# Patient Record
Sex: Female | Born: 1990 | Race: Asian | Hispanic: No | Marital: Married | Smoking: Never smoker
Health system: Southern US, Community
[De-identification: ages and names within clinical notes are randomized; demographics above are authoritative.]

## PROBLEM LIST (undated history)

## (undated) ENCOUNTER — Inpatient Hospital Stay (HOSPITAL_COMMUNITY): Payer: Medicaid Other

## (undated) DIAGNOSIS — Z789 Other specified health status: Secondary | ICD-10-CM

---
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
## 2010-08-21 ENCOUNTER — Inpatient Hospital Stay (HOSPITAL_COMMUNITY): Admission: AD | Admit: 2010-08-21 | Discharge: 2010-08-24 | Payer: Self-pay | Admitting: Obstetrics and Gynecology

## 2010-09-29 ENCOUNTER — Inpatient Hospital Stay (HOSPITAL_COMMUNITY): Admission: AD | Admit: 2010-09-29 | Discharge: 2010-10-01 | Payer: Self-pay | Admitting: Obstetrics and Gynecology

## 2011-01-12 LAB — CBC
HCT: 31.9 % — ABNORMAL LOW (ref 36.0–46.0)
HCT: 36.4 % (ref 36.0–46.0)
Hemoglobin: 12.3 g/dL (ref 12.0–15.0)
MCHC: 33.7 g/dL (ref 30.0–36.0)
MCV: 81.8 fL (ref 78.0–100.0)
RBC: 3.91 MIL/uL (ref 3.87–5.11)
WBC: 14.4 10*3/uL — ABNORMAL HIGH (ref 4.0–10.5)
WBC: 15.3 10*3/uL — ABNORMAL HIGH (ref 4.0–10.5)

## 2011-01-13 LAB — DIFFERENTIAL
Basophils Absolute: 0 10*3/uL (ref 0.0–0.1)
Eosinophils Absolute: 0.1 10*3/uL (ref 0.0–0.7)
Eosinophils Relative: 1 % (ref 0–5)
Monocytes Absolute: 0.8 10*3/uL (ref 0.1–1.0)
Monocytes Relative: 6 % (ref 3–12)
Neutro Abs: 10.7 10*3/uL — ABNORMAL HIGH (ref 1.7–7.7)

## 2011-01-13 LAB — RPR: RPR Ser Ql: NONREACTIVE

## 2011-01-13 LAB — CBC
Platelets: 183 10*3/uL (ref 150–400)
RDW: 14.5 % (ref 11.5–15.5)
WBC: 13.6 10*3/uL — ABNORMAL HIGH (ref 4.0–10.5)

## 2011-01-13 LAB — URINALYSIS, MICROSCOPIC ONLY
Bilirubin Urine: NEGATIVE
Ketones, ur: 15 mg/dL — AB
Leukocytes, UA: NEGATIVE
Nitrite: NEGATIVE
Specific Gravity, Urine: 1.025 (ref 1.005–1.030)
Urobilinogen, UA: 0.2 mg/dL (ref 0.0–1.0)

## 2011-01-13 LAB — COMPREHENSIVE METABOLIC PANEL
ALT: 15 U/L (ref 0–35)
Albumin: 3 g/dL — ABNORMAL LOW (ref 3.5–5.2)
Alkaline Phosphatase: 101 U/L (ref 39–117)
Potassium: 3.4 mEq/L — ABNORMAL LOW (ref 3.5–5.1)
Sodium: 138 mEq/L (ref 135–145)
Total Protein: 6.4 g/dL (ref 6.0–8.3)

## 2011-01-13 LAB — STREP B DNA PROBE: Strep Group B Ag: NEGATIVE

## 2011-01-13 LAB — GC/CHLAMYDIA PROBE AMP, GENITAL: Chlamydia, DNA Probe: NEGATIVE

## 2011-05-03 IMAGING — US US OB COMP +14 WK
1 series · 14 of 28 positions shown · non-contrast
Comparison: none

OBSTETRICAL ULTRASOUND:
 This ultrasound exam was performed in the [HOSPITAL] Ultrasound Department.  The OB US report was generated in the AS system, and faxed to the ordering physician.  This report is also available in [HOSPITAL]?s [employer] and in [REDACTED] PACS.

[Series 1: us ob comp +14 wk · 0.25mm/px · 14 of 79 slices shown]
[im 3/79]
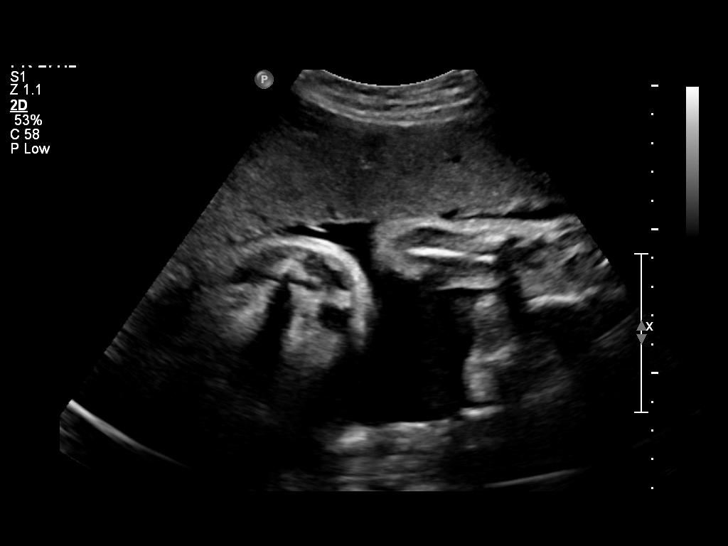
[im 9/79]
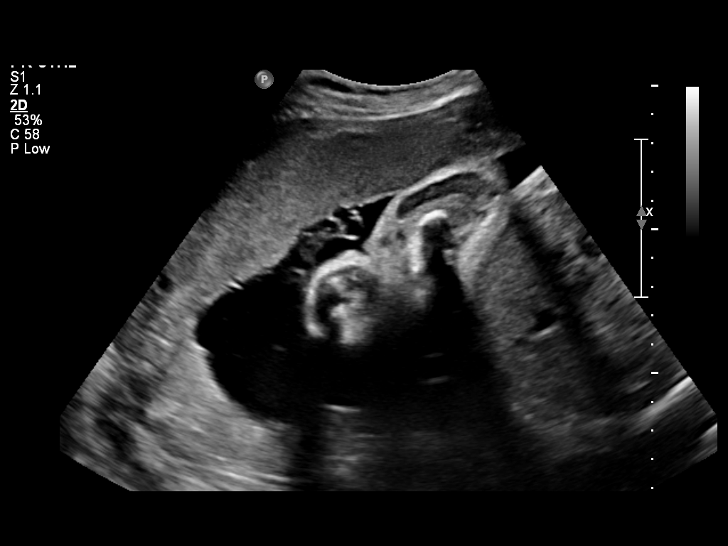
[im 15/79]
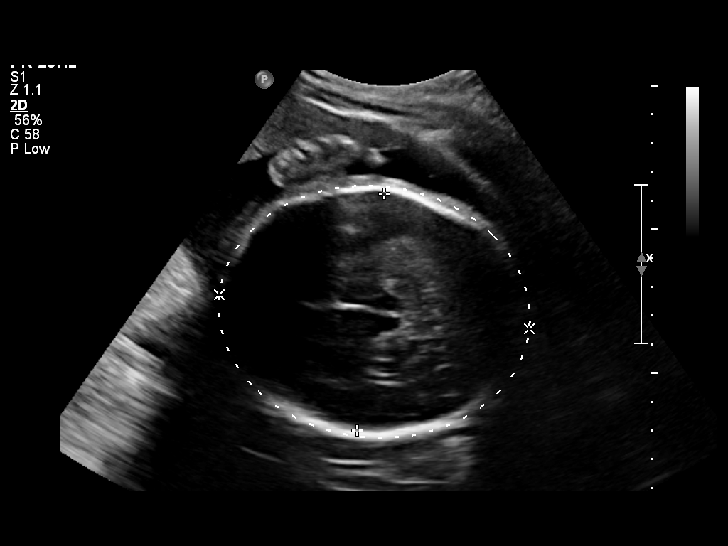
[im 21/79]
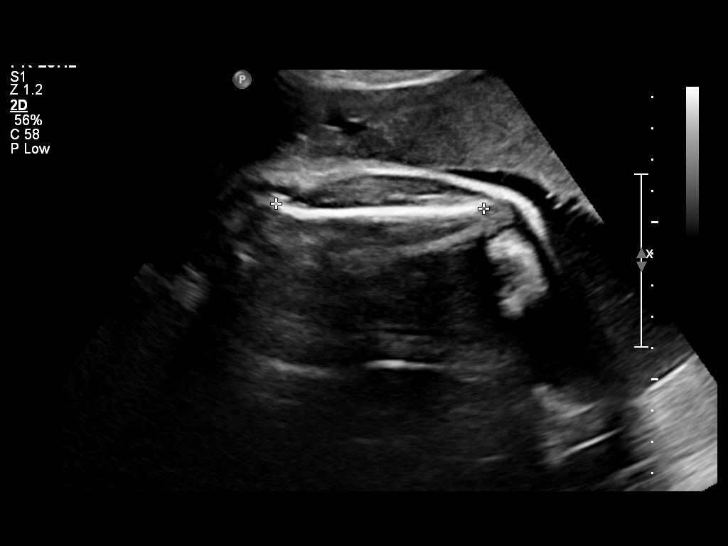
[im 27/79]
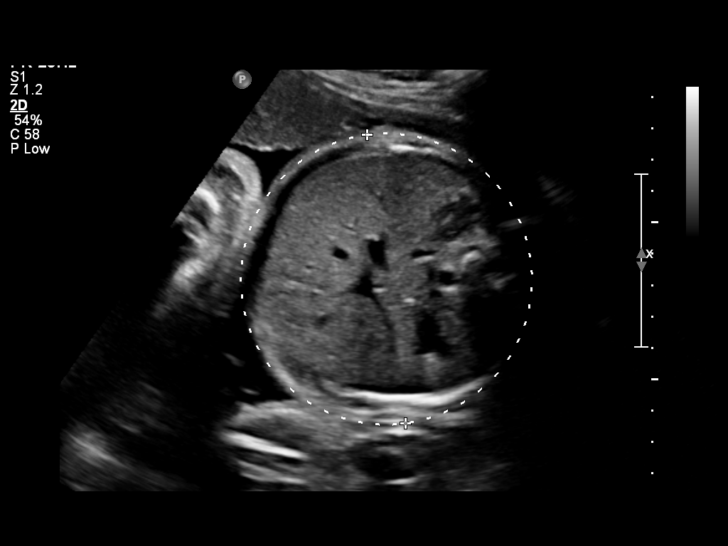
[im 32/79]
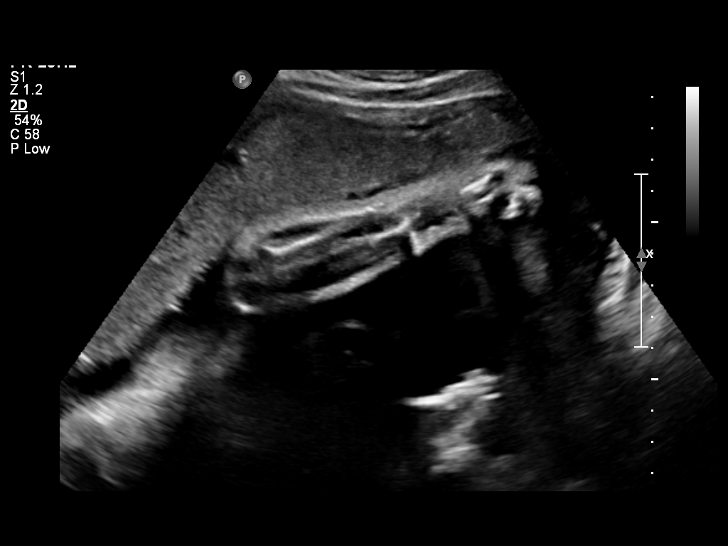
[im 38/79]
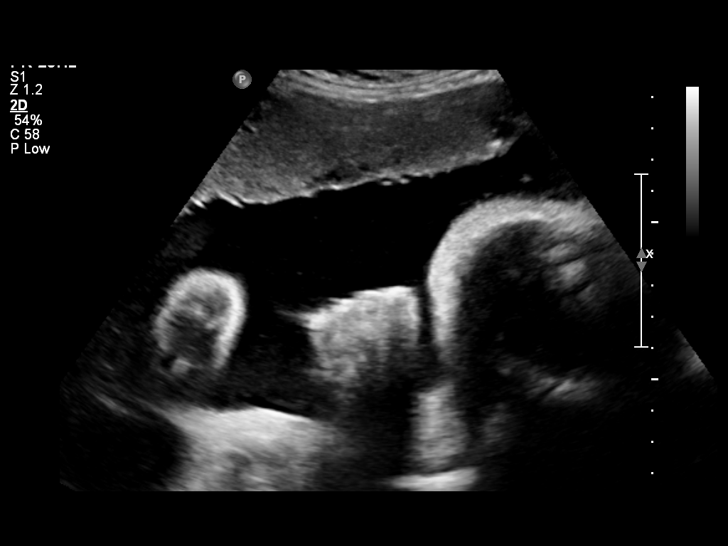
[im 44/79]
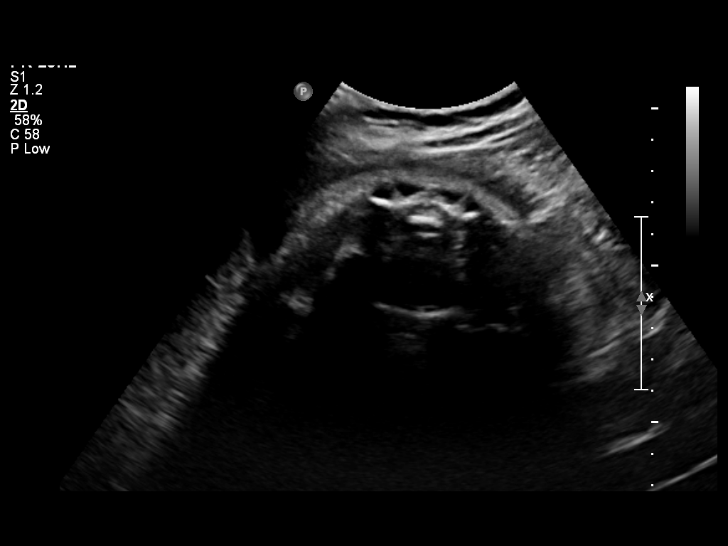
[im 50/79]
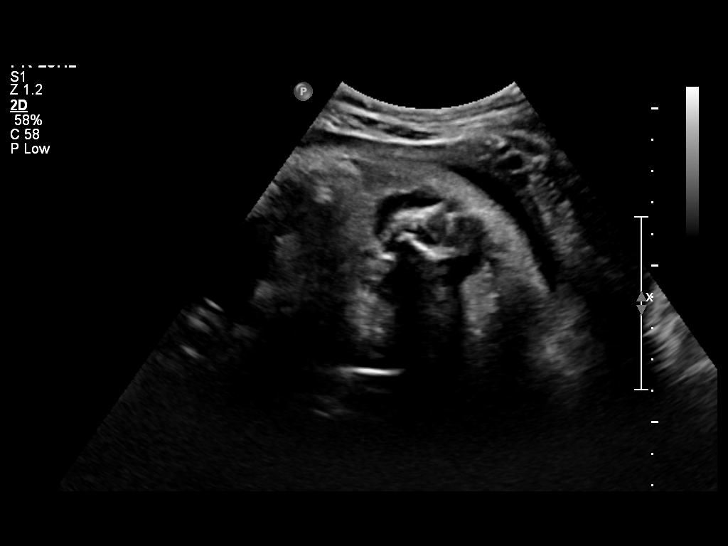
[im 55/79]
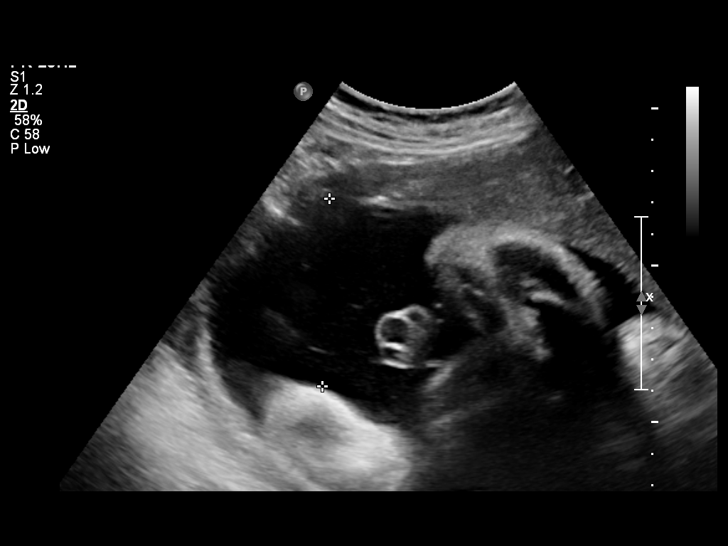
[im 61/79]
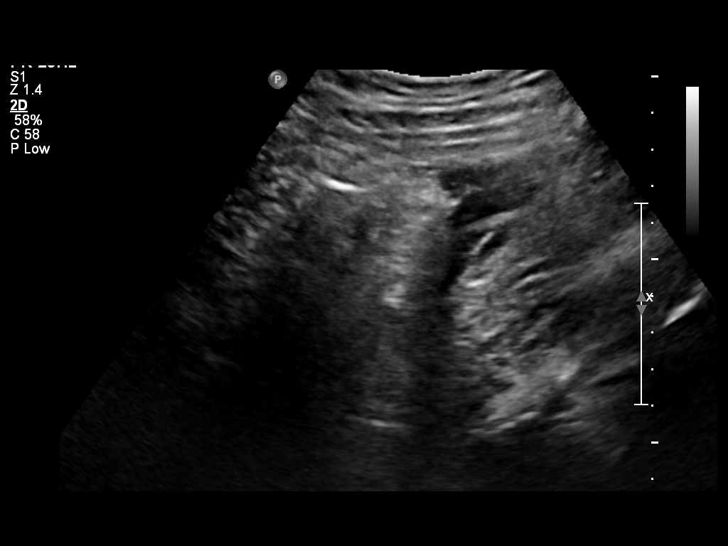
[im 67/79]
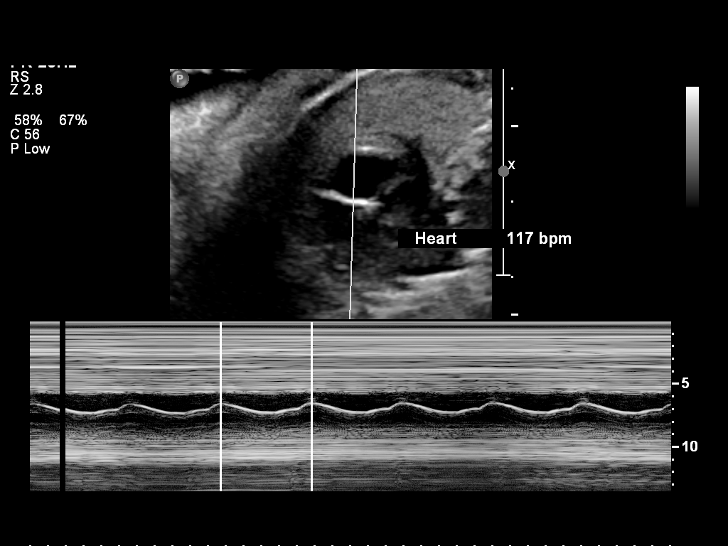
[im 73/79]
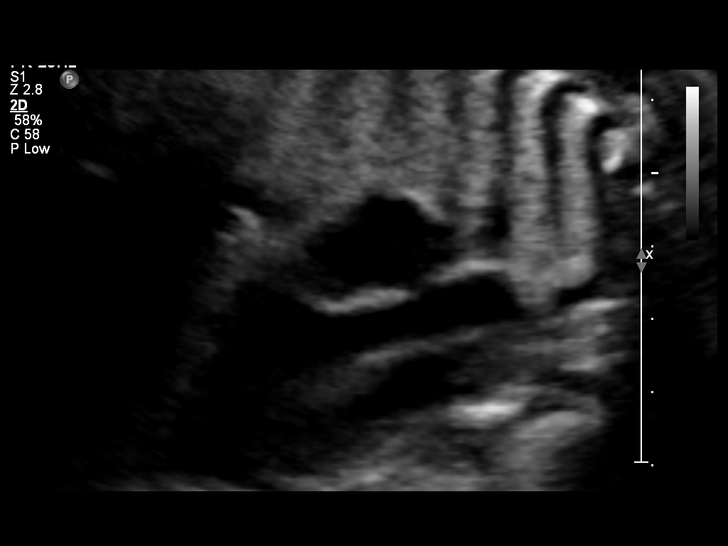
[im 79/79]
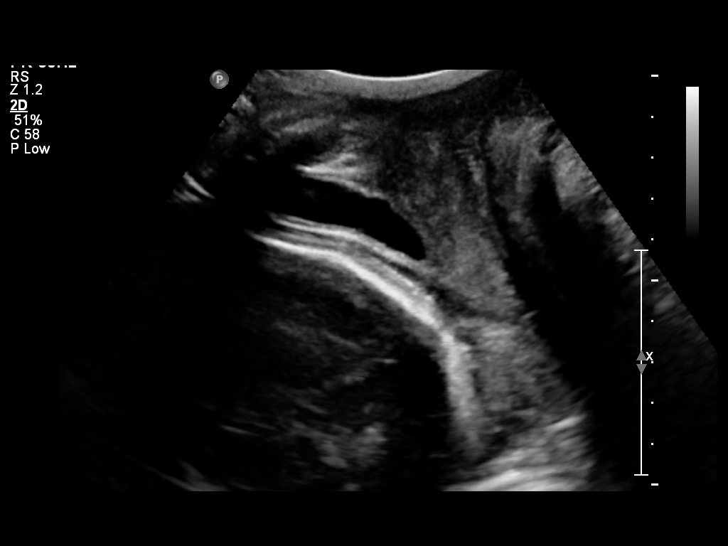

[14 of 28 positions shown; findings below may reference images not displayed]

IMPRESSION: See AS Obstetric US report.

## 2012-01-25 LAB — OB RESULTS CONSOLE HIV ANTIBODY (ROUTINE TESTING): HIV: NONREACTIVE

## 2012-01-25 LAB — OB RESULTS CONSOLE GC/CHLAMYDIA
Chlamydia: NEGATIVE
Gonorrhea: NEGATIVE

## 2012-01-25 LAB — OB RESULTS CONSOLE HEPATITIS B SURFACE ANTIGEN: Hepatitis B Surface Ag: NEGATIVE

## 2012-01-25 LAB — OB RESULTS CONSOLE RPR: RPR: NONREACTIVE

## 2012-01-25 LAB — OB RESULTS CONSOLE RUBELLA ANTIBODY, IGM: Rubella: IMMUNE

## 2012-05-28 ENCOUNTER — Inpatient Hospital Stay (HOSPITAL_COMMUNITY)
Admission: AD | Admit: 2012-05-28 | Discharge: 2012-05-31 | DRG: 766 | Disposition: A | Discharge status: Home / Self Care | Payer: Medicaid Other | Source: Ambulatory Visit | Attending: Obstetrics & Gynecology | Admitting: Obstetrics & Gynecology

## 2012-05-28 ENCOUNTER — Inpatient Hospital Stay (HOSPITAL_COMMUNITY): Payer: Medicaid Other | Admitting: Anesthesiology

## 2012-05-28 ENCOUNTER — Encounter (HOSPITAL_COMMUNITY): Payer: Self-pay | Admitting: Anesthesiology

## 2012-05-28 ENCOUNTER — Encounter (HOSPITAL_COMMUNITY): Payer: Self-pay | Admitting: *Deleted

## 2012-05-28 ENCOUNTER — Encounter (HOSPITAL_COMMUNITY)
Admission: AD | Disposition: A | Discharge status: Home / Self Care | Payer: Self-pay | Source: Ambulatory Visit | Attending: Obstetrics & Gynecology

## 2012-05-28 DIAGNOSIS — Z98891 History of uterine scar from previous surgery: Secondary | ICD-10-CM

## 2012-05-28 DIAGNOSIS — IMO0001 Reserved for inherently not codable concepts without codable children: Secondary | ICD-10-CM

## 2012-05-28 HISTORY — DX: Other specified health status: Z78.9

## 2012-05-28 LAB — CBC
HCT: 39.3 % (ref 36.0–46.0)
Hemoglobin: 12.9 g/dL (ref 12.0–15.0)
MCH: 26.2 pg (ref 26.0–34.0)
MCV: 79.7 fL (ref 78.0–100.0)
Platelets: 198 10*3/uL (ref 150–400)
RBC: 4.93 MIL/uL (ref 3.87–5.11)
WBC: 13.7 10*3/uL — ABNORMAL HIGH (ref 4.0–10.5)

## 2012-05-28 SURGERY — Surgical Case
Anesthesia: Regional | Site: Abdomen | Wound class: Clean Contaminated

## 2012-05-28 MED ORDER — MEPERIDINE HCL 25 MG/ML IJ SOLN: 6.2500 mg | INTRAMUSCULAR | Status: DC | PRN

## 2012-05-28 MED ORDER — EPHEDRINE SULFATE 50 MG/ML IJ SOLN
INTRAMUSCULAR | Status: DC | PRN
  Administered 2012-05-28 (×3): 10 mg via INTRAVENOUS

## 2012-05-28 MED ORDER — FERROUS SULFATE 325 (65 FE) MG PO TABS
325.0000 mg | ORAL_TABLET | Freq: Two times a day (BID) | ORAL | Status: DC
  Administered 2012-05-29 – 2012-05-31 (×4): 325 mg via ORAL
  Filled 2012-05-28 (×4): qty 1

## 2012-05-28 MED ORDER — PRENATAL MULTIVITAMIN CH
1.0000 | ORAL_TABLET | Freq: Every day | ORAL | Status: DC
  Administered 2012-05-29 – 2012-05-31 (×3): 1 via ORAL
  Filled 2012-05-28 (×3): qty 1

## 2012-05-28 MED ORDER — OXYTOCIN 10 UNIT/ML IJ SOLN
INTRAMUSCULAR | Status: AC
  Filled 2012-05-28: qty 4

## 2012-05-28 MED ORDER — METOCLOPRAMIDE HCL 5 MG/ML IJ SOLN: 10.0000 mg | Freq: Three times a day (TID) | INTRAMUSCULAR | Status: DC | PRN

## 2012-05-28 MED ORDER — NALBUPHINE HCL 10 MG/ML IJ SOLN
5.0000 mg | INTRAMUSCULAR | Status: DC | PRN
  Filled 2012-05-28: qty 1

## 2012-05-28 MED ORDER — LACTATED RINGERS IV SOLN
INTRAVENOUS | Status: DC
  Administered 2012-05-28: 125 mL/h via INTRAVENOUS
  Administered 2012-05-29: 05:00:00 via INTRAVENOUS

## 2012-05-28 MED ORDER — EPHEDRINE 5 MG/ML INJ
INTRAVENOUS | Status: AC
  Filled 2012-05-28: qty 10

## 2012-05-28 MED ORDER — KETOROLAC TROMETHAMINE 60 MG/2ML IM SOLN
INTRAMUSCULAR | Status: AC
  Administered 2012-05-28: 60 mg via INTRAMUSCULAR
  Filled 2012-05-28: qty 2

## 2012-05-28 MED ORDER — KETOROLAC TROMETHAMINE 60 MG/2ML IM SOLN
60.0000 mg | Freq: Once | INTRAMUSCULAR | Status: AC | PRN
  Administered 2012-05-28: 60 mg via INTRAMUSCULAR

## 2012-05-28 MED ORDER — FLEET ENEMA 7-19 GM/118ML RE ENEM: 1.0000 | ENEMA | RECTAL | Status: DC | PRN

## 2012-05-28 MED ORDER — ONDANSETRON HCL 4 MG/2ML IJ SOLN
INTRAMUSCULAR | Status: DC | PRN
  Administered 2012-05-28: 4 mg via INTRAVENOUS

## 2012-05-28 MED ORDER — OXYTOCIN 40 UNITS IN LACTATED RINGERS INFUSION - SIMPLE MED
62.5000 mL/h | Freq: Once | INTRAVENOUS | Status: DC
  Filled 2012-05-28: qty 1000

## 2012-05-28 MED ORDER — CITRIC ACID-SODIUM CITRATE 334-500 MG/5ML PO SOLN
30.0000 mL | ORAL | Status: DC | PRN
  Administered 2012-05-28: 30 mL via ORAL
  Filled 2012-05-28: qty 15

## 2012-05-28 MED ORDER — ACETAMINOPHEN 325 MG PO TABS: 650.0000 mg | ORAL_TABLET | ORAL | Status: DC | PRN

## 2012-05-28 MED ORDER — PHENYLEPHRINE 40 MCG/ML (10ML) SYRINGE FOR IV PUSH (FOR BLOOD PRESSURE SUPPORT)
PREFILLED_SYRINGE | INTRAVENOUS | Status: AC
  Filled 2012-05-28: qty 5

## 2012-05-28 MED ORDER — OXYTOCIN 40 UNITS IN LACTATED RINGERS INFUSION - SIMPLE MED: 62.5000 mL/h | INTRAVENOUS | Status: AC

## 2012-05-28 MED ORDER — SIMETHICONE 80 MG PO CHEW
80.0000 mg | CHEWABLE_TABLET | ORAL | Status: DC | PRN
  Administered 2012-05-28 – 2012-05-31 (×3): 80 mg via ORAL

## 2012-05-28 MED ORDER — PHENYLEPHRINE 40 MCG/ML (10ML) SYRINGE FOR IV PUSH (FOR BLOOD PRESSURE SUPPORT)
80.0000 ug | PREFILLED_SYRINGE | INTRAVENOUS | Status: DC | PRN
  Filled 2012-05-28: qty 5
  Filled 2012-05-28: qty 2

## 2012-05-28 MED ORDER — MEPERIDINE HCL 25 MG/ML IJ SOLN
INTRAMUSCULAR | Status: DC | PRN
  Administered 2012-05-28 (×2): 12.5 mg via INTRAVENOUS

## 2012-05-28 MED ORDER — MAGNESIUM HYDROXIDE 400 MG/5ML PO SUSP: 30.0000 mL | ORAL | Status: DC | PRN

## 2012-05-28 MED ORDER — MORPHINE SULFATE (PF) 0.5 MG/ML IJ SOLN
INTRAMUSCULAR | Status: DC | PRN
  Administered 2012-05-28: 1.5 mg via INTRAVENOUS

## 2012-05-28 MED ORDER — MEPERIDINE HCL 25 MG/ML IJ SOLN
INTRAMUSCULAR | Status: AC
  Filled 2012-05-28: qty 1

## 2012-05-28 MED ORDER — DIPHENHYDRAMINE HCL 50 MG/ML IJ SOLN: 12.5000 mg | INTRAMUSCULAR | Status: DC | PRN

## 2012-05-28 MED ORDER — OXYCODONE-ACETAMINOPHEN 5-325 MG PO TABS: 1.0000 | ORAL_TABLET | ORAL | Status: DC | PRN

## 2012-05-28 MED ORDER — LACTATED RINGERS IV SOLN: 500.0000 mL | INTRAVENOUS | Status: DC | PRN

## 2012-05-28 MED ORDER — OXYTOCIN BOLUS FROM INFUSION
250.0000 mL | Freq: Once | INTRAVENOUS | Status: DC
  Filled 2012-05-28: qty 500

## 2012-05-28 MED ORDER — WITCH HAZEL-GLYCERIN EX PADS: 1.0000 "application " | MEDICATED_PAD | CUTANEOUS | Status: DC | PRN

## 2012-05-28 MED ORDER — ONDANSETRON HCL 4 MG/2ML IJ SOLN
INTRAMUSCULAR | Status: AC
  Filled 2012-05-28: qty 2

## 2012-05-28 MED ORDER — LACTATED RINGERS IV SOLN
INTRAVENOUS | Status: DC | PRN
  Administered 2012-05-28: 18:00:00 via INTRAVENOUS

## 2012-05-28 MED ORDER — SCOPOLAMINE 1 MG/3DAYS TD PT72
MEDICATED_PATCH | TRANSDERMAL | Status: AC
  Administered 2012-05-28: 1.5 mg via TRANSDERMAL
  Filled 2012-05-28: qty 1

## 2012-05-28 MED ORDER — SODIUM CHLORIDE 0.9 % IR SOLN
Status: DC | PRN
  Administered 2012-05-28: 2000 mL

## 2012-05-28 MED ORDER — TETANUS-DIPHTH-ACELL PERTUSSIS 5-2.5-18.5 LF-MCG/0.5 IM SUSP: 0.5000 mL | Freq: Once | INTRAMUSCULAR | Status: DC

## 2012-05-28 MED ORDER — CEFAZOLIN SODIUM 1-5 GM-% IV SOLN
INTRAVENOUS | Status: DC | PRN
  Administered 2012-05-28: 2 g via INTRAVENOUS

## 2012-05-28 MED ORDER — DIPHENHYDRAMINE HCL 50 MG/ML IJ SOLN: 25.0000 mg | INTRAMUSCULAR | Status: DC | PRN

## 2012-05-28 MED ORDER — PHENYLEPHRINE 40 MCG/ML (10ML) SYRINGE FOR IV PUSH (FOR BLOOD PRESSURE SUPPORT)
80.0000 ug | PREFILLED_SYRINGE | INTRAVENOUS | Status: DC | PRN
  Filled 2012-05-28: qty 2

## 2012-05-28 MED ORDER — LACTATED RINGERS IV SOLN
INTRAVENOUS | Status: DC
  Administered 2012-05-28: 17:00:00 via INTRAVENOUS

## 2012-05-28 MED ORDER — SODIUM BICARBONATE 8.4 % IV SOLN
INTRAVENOUS | Status: AC
  Filled 2012-05-28: qty 50

## 2012-05-28 MED ORDER — KETOROLAC TROMETHAMINE 30 MG/ML IJ SOLN: 30.0000 mg | Freq: Four times a day (QID) | INTRAMUSCULAR | Status: AC | PRN

## 2012-05-28 MED ORDER — DIPHENHYDRAMINE HCL 25 MG PO CAPS: 25.0000 mg | ORAL_CAPSULE | Freq: Four times a day (QID) | ORAL | Status: DC | PRN

## 2012-05-28 MED ORDER — MEASLES, MUMPS & RUBELLA VAC ~~LOC~~ INJ
0.5000 mL | INJECTION | Freq: Once | SUBCUTANEOUS | Status: DC
  Filled 2012-05-28: qty 0.5

## 2012-05-28 MED ORDER — PROPOFOL 10 MG/ML IV EMUL
INTRAVENOUS | Status: AC
  Filled 2012-05-28: qty 20

## 2012-05-28 MED ORDER — LACTATED RINGERS IV SOLN
500.0000 mL | Freq: Once | INTRAVENOUS | Status: AC
  Administered 2012-05-28: 500 mL via INTRAVENOUS

## 2012-05-28 MED ORDER — ONDANSETRON HCL 4 MG PO TABS: 4.0000 mg | ORAL_TABLET | ORAL | Status: DC | PRN

## 2012-05-28 MED ORDER — DIPHENHYDRAMINE HCL 50 MG/ML IJ SOLN
12.5000 mg | INTRAMUSCULAR | Status: DC | PRN
Start: 2012-05-28 — End: 2012-05-28

## 2012-05-28 MED ORDER — LIDOCAINE-EPINEPHRINE (PF) 2 %-1:200000 IJ SOLN
INTRAMUSCULAR | Status: AC
  Filled 2012-05-28: qty 20

## 2012-05-28 MED ORDER — SODIUM CHLORIDE 0.9 % IJ SOLN: 3.0000 mL | INTRAMUSCULAR | Status: DC | PRN

## 2012-05-28 MED ORDER — LIDOCAINE HCL (PF) 1 % IJ SOLN
INTRAMUSCULAR | Status: DC | PRN
  Administered 2012-05-28 (×2): 8 mL

## 2012-05-28 MED ORDER — MORPHINE SULFATE (PF) 0.5 MG/ML IJ SOLN
INTRAMUSCULAR | Status: DC | PRN
  Administered 2012-05-28: 3.5 mg via EPIDURAL

## 2012-05-28 MED ORDER — ONDANSETRON HCL 4 MG/2ML IJ SOLN: 4.0000 mg | Freq: Four times a day (QID) | INTRAMUSCULAR | Status: DC | PRN

## 2012-05-28 MED ORDER — NALOXONE HCL 0.4 MG/ML IJ SOLN: 0.4000 mg | INTRAMUSCULAR | Status: DC | PRN

## 2012-05-28 MED ORDER — MORPHINE SULFATE 0.5 MG/ML IJ SOLN
INTRAMUSCULAR | Status: AC
  Filled 2012-05-28: qty 10

## 2012-05-28 MED ORDER — IBUPROFEN 600 MG PO TABS: 600.0000 mg | ORAL_TABLET | Freq: Four times a day (QID) | ORAL | Status: DC | PRN

## 2012-05-28 MED ORDER — CEFAZOLIN SODIUM-DEXTROSE 2-3 GM-% IV SOLR
INTRAVENOUS | Status: AC
  Filled 2012-05-28: qty 50

## 2012-05-28 MED ORDER — SCOPOLAMINE 1 MG/3DAYS TD PT72
1.0000 | MEDICATED_PATCH | Freq: Once | TRANSDERMAL | Status: DC
  Administered 2012-05-28: 1.5 mg via TRANSDERMAL

## 2012-05-28 MED ORDER — MEDROXYPROGESTERONE ACETATE 150 MG/ML IM SUSP: 150.0000 mg | INTRAMUSCULAR | Status: DC | PRN

## 2012-05-28 MED ORDER — MISOPROSTOL 25 MCG QUARTER TABLET
75.0000 ug | ORAL_TABLET | Freq: Once | ORAL | Status: AC
  Administered 2012-05-28: 75 ug via ORAL
  Filled 2012-05-28: qty 0.75

## 2012-05-28 MED ORDER — PHENYLEPHRINE HCL 10 MG/ML IJ SOLN
INTRAMUSCULAR | Status: DC | PRN
  Administered 2012-05-28: 80 ug via INTRAVENOUS
  Administered 2012-05-28: 120 ug via INTRAVENOUS

## 2012-05-28 MED ORDER — FENTANYL 2.5 MCG/ML BUPIVACAINE 1/10 % EPIDURAL INFUSION (WH - ANES)
14.0000 mL/h | INTRAMUSCULAR | Status: DC
  Administered 2012-05-28: 14 mL/h via EPIDURAL
  Filled 2012-05-28 (×2): qty 60

## 2012-05-28 MED ORDER — TERBUTALINE SULFATE 1 MG/ML IJ SOLN
INTRAMUSCULAR | Status: AC
  Administered 2012-05-28: 1 mg
  Filled 2012-05-28: qty 1

## 2012-05-28 MED ORDER — NALOXONE HCL 0.4 MG/ML IJ SOLN
1.0000 ug/kg/h | INTRAMUSCULAR | Status: DC | PRN
  Filled 2012-05-28: qty 2.5

## 2012-05-28 MED ORDER — KETOROLAC TROMETHAMINE 30 MG/ML IJ SOLN: 15.0000 mg | Freq: Once | INTRAMUSCULAR | Status: DC | PRN

## 2012-05-28 MED ORDER — LIDOCAINE HCL (PF) 1 % IJ SOLN
30.0000 mL | INTRAMUSCULAR | Status: DC | PRN
  Filled 2012-05-28 (×2): qty 30

## 2012-05-28 MED ORDER — PROMETHAZINE HCL 25 MG/ML IJ SOLN: 6.2500 mg | INTRAMUSCULAR | Status: DC | PRN

## 2012-05-28 MED ORDER — OXYTOCIN 10 UNIT/ML IJ SOLN
40.0000 [IU] | INTRAVENOUS | Status: DC | PRN
  Administered 2012-05-28: 40 [IU] via INTRAVENOUS

## 2012-05-28 MED ORDER — DIPHENHYDRAMINE HCL 25 MG PO CAPS: 25.0000 mg | ORAL_CAPSULE | ORAL | Status: DC | PRN

## 2012-05-28 MED ORDER — ONDANSETRON HCL 4 MG/2ML IJ SOLN: 4.0000 mg | INTRAMUSCULAR | Status: DC | PRN

## 2012-05-28 MED ORDER — IBUPROFEN 600 MG PO TABS
600.0000 mg | ORAL_TABLET | Freq: Four times a day (QID) | ORAL | Status: DC
  Administered 2012-05-29 – 2012-05-31 (×9): 600 mg via ORAL
  Filled 2012-05-28 (×10): qty 1

## 2012-05-28 MED ORDER — OXYCODONE-ACETAMINOPHEN 5-325 MG PO TABS
1.0000 | ORAL_TABLET | ORAL | Status: DC | PRN
  Administered 2012-05-29: 1 via ORAL
  Administered 2012-05-29 – 2012-05-31 (×7): 2 via ORAL
  Filled 2012-05-28: qty 1
  Filled 2012-05-28 (×3): qty 2
  Filled 2012-05-28: qty 1
  Filled 2012-05-28 (×4): qty 2

## 2012-05-28 MED ORDER — SENNOSIDES-DOCUSATE SODIUM 8.6-50 MG PO TABS
2.0000 | ORAL_TABLET | Freq: Every day | ORAL | Status: DC
  Administered 2012-05-28 – 2012-05-29 (×2): 2 via ORAL

## 2012-05-28 MED ORDER — DIBUCAINE 1 % RE OINT: 1.0000 "application " | TOPICAL_OINTMENT | RECTAL | Status: DC | PRN

## 2012-05-28 MED ORDER — FENTANYL 2.5 MCG/ML BUPIVACAINE 1/10 % EPIDURAL INFUSION (WH - ANES)
INTRAMUSCULAR | Status: DC | PRN
  Administered 2012-05-28: 14 mL/h via EPIDURAL

## 2012-05-28 MED ORDER — EPHEDRINE 5 MG/ML INJ
10.0000 mg | INTRAVENOUS | Status: DC | PRN
  Filled 2012-05-28: qty 2
  Filled 2012-05-28: qty 4

## 2012-05-28 MED ORDER — EPHEDRINE 5 MG/ML INJ
10.0000 mg | INTRAVENOUS | Status: DC | PRN
  Filled 2012-05-28: qty 2

## 2012-05-28 MED ORDER — LANOLIN HYDROUS EX OINT: 1.0000 "application " | TOPICAL_OINTMENT | CUTANEOUS | Status: DC | PRN

## 2012-05-28 MED ORDER — ONDANSETRON HCL 4 MG/2ML IJ SOLN: 4.0000 mg | Freq: Three times a day (TID) | INTRAMUSCULAR | Status: DC | PRN

## 2012-05-28 MED ORDER — HYDROMORPHONE HCL PF 1 MG/ML IJ SOLN: 0.2500 mg | INTRAMUSCULAR | Status: DC | PRN

## 2012-05-28 MED ORDER — SODIUM BICARBONATE 8.4 % IV SOLN
INTRAVENOUS | Status: DC | PRN
  Administered 2012-05-28: 10 mL via EPIDURAL

## 2012-05-28 MED ORDER — ZOLPIDEM TARTRATE 5 MG PO TABS: 5.0000 mg | ORAL_TABLET | Freq: Every evening | ORAL | Status: DC | PRN

## 2012-05-28 SURGICAL SUPPLY — 46 items
APL SKNCLS STERI-STRIP NONHPOA (GAUZE/BANDAGES/DRESSINGS) ×1
BENZOIN TINCTURE PRP APPL 2/3 (GAUZE/BANDAGES/DRESSINGS) ×2 IMPLANT
CANISTER WOUND CARE 500ML ATS (WOUND CARE) IMPLANT
CHLORAPREP W/TINT 26ML (MISCELLANEOUS) ×1 IMPLANT
CLOTH BEACON ORANGE TIMEOUT ST (SAFETY) ×2 IMPLANT
CONTAINER PREFILL 10% NBF 15ML (MISCELLANEOUS) IMPLANT
DRESSING TELFA 8X3 (GAUZE/BANDAGES/DRESSINGS) ×2 IMPLANT
DRSG COVADERM 4X10 (GAUZE/BANDAGES/DRESSINGS) ×1 IMPLANT
DRSG VAC ATS LRG SENSATRAC (GAUZE/BANDAGES/DRESSINGS) IMPLANT
DRSG VAC ATS MED SENSATRAC (GAUZE/BANDAGES/DRESSINGS) IMPLANT
DRSG VAC ATS SM SENSATRAC (GAUZE/BANDAGES/DRESSINGS) IMPLANT
ELECT REM PT RETURN 9FT ADLT (ELECTROSURGICAL) ×2
ELECTRODE REM PT RTRN 9FT ADLT (ELECTROSURGICAL) ×1 IMPLANT
EXTRACTOR VACUUM M CUP 4 TUBE (SUCTIONS) IMPLANT
FORMULA 20CAL 3 OZ MEAD (FORMULA) ×1 IMPLANT
GAUZE SPONGE 4X4 12PLY STRL LF (GAUZE/BANDAGES/DRESSINGS) ×2 IMPLANT
GLOVE BIO SURGEON STRL SZ 6.5 (GLOVE) ×4 IMPLANT
GOWN PREVENTION PLUS LG XLONG (DISPOSABLE) ×5 IMPLANT
KIT ABG SYR 3ML LUER SLIP (SYRINGE) ×1 IMPLANT
NDL HYPO 25X5/8 SAFETYGLIDE (NEEDLE) ×1 IMPLANT
NEEDLE HYPO 25X5/8 SAFETYGLIDE (NEEDLE) ×2 IMPLANT
NS IRRIG 1000ML POUR BTL (IV SOLUTION) ×2 IMPLANT
PACK C SECTION WH (CUSTOM PROCEDURE TRAY) ×2 IMPLANT
PAD ABD 7.5X8 STRL (GAUZE/BANDAGES/DRESSINGS) ×2 IMPLANT
RTRCTR C-SECT PINK 25CM LRG (MISCELLANEOUS) IMPLANT
RTRCTR C-SECT PINK 34CM XLRG (MISCELLANEOUS) IMPLANT
SET CYSTO W/LG BORE CLAMP LF (SET/KITS/TRAYS/PACK) ×1 IMPLANT
SLEEVE SCD COMPRESS KNEE MED (MISCELLANEOUS) ×1 IMPLANT
STAPLER VISISTAT 35W (STAPLE) IMPLANT
STRIP CLOSURE SKIN 1/2X4 (GAUZE/BANDAGES/DRESSINGS) ×2 IMPLANT
SUT MNCRL 0 VIOLET CTX 36 (SUTURE) ×2 IMPLANT
SUT MNCRL AB 3-0 PS2 27 (SUTURE) ×1 IMPLANT
SUT MONOCRYL 0 CTX 36 (SUTURE) ×2
SUT PDS AB 0 CTX 36 PDP370T (SUTURE) ×1 IMPLANT
SUT PLAIN 0 NONE (SUTURE) IMPLANT
SUT VIC AB 0 CTXB 36 (SUTURE) ×2 IMPLANT
SUT VIC AB 2-0 CT1 (SUTURE) IMPLANT
SUT VIC AB 2-0 CT1 27 (SUTURE) ×2
SUT VIC AB 2-0 CT1 TAPERPNT 27 (SUTURE) ×1 IMPLANT
SUT VIC AB 2-0 SH 27 (SUTURE)
SUT VIC AB 2-0 SH 27XBRD (SUTURE) IMPLANT
TAPE CLOTH SURG 4X10 WHT LF (GAUZE/BANDAGES/DRESSINGS) ×1 IMPLANT
TOWEL OR 17X24 6PK STRL BLUE (TOWEL DISPOSABLE) ×4 IMPLANT
TRAY FOLEY CATH 14FR (SET/KITS/TRAYS/PACK) ×2 IMPLANT
WATER STERILE IRR 1000ML POUR (IV SOLUTION) ×2 IMPLANT
WATER STERILE IRR 1000ML UROMA (IV SOLUTION) ×1 IMPLANT

## 2012-05-28 NOTE — Anesthesia Procedure Notes (Signed)
Epidural Patient location during procedure: OB Start time: 05/28/2012 1:27 PM End time: 05/28/2012 1:32 PM Reason for block: procedure for pain  Staffing Anesthesiologist: Sandrea Hughs Performed by: anesthesiologist   Preanesthetic Checklist Completed: patient identified, site marked, surgical consent, pre-op evaluation, timeout performed, IV checked, risks and benefits discussed and monitors and equipment checked  Epidural Patient position: sitting Prep: site prepped and draped and DuraPrep Patient monitoring: continuous pulse ox and blood pressure Approach: midline Injection technique: LOR air  Needle:  Needle type: Tuohy  Needle gauge: 17 G Needle length: 9 cm Needle insertion depth: 5 cm cm Catheter type: closed end flexible Catheter size: 19 Gauge Catheter at skin depth: 10 cm Test dose: negative and Other  Assessment Sensory level: T8 Events: blood not aspirated, injection not painful, no injection resistance, negative IV test and no paresthesia

## 2012-05-28 NOTE — OR Nursing (Signed)
Sterile water with 2 ounces  of sterile milk instilled into bladder before closing fascia to check for bladder tears-none  Noted per Dr. Tamela Oddi.

## 2012-05-28 NOTE — Progress Notes (Signed)
Sandra Wolfe is a 21 y.o. G2P1001 at [redacted]w[redacted]d by LMP admitted for active labor  Subjective: Pushing  Objective: BP 121/68  Pulse 81  Temp 98.1 F (36.7 C) (Oral)  Resp 20  Ht 4\' 11"  (1.499 m)  Wt 61.689 kg (136 lb)  BMI 27.47 kg/m2  SpO2 100% I/O last 3 completed shifts: In: 800 [I.V.:800] Out: 800 [Urine:100; Blood:700]    FHT:  FHR baseline 80s - 90s UC:   Every minute SVE:  FD/direct OA/+1 station Patient consented verbally for a vacuum-assisted delivery.  The bladder was catheterized. The vacuum was applied.  The pressure was 60.  There was minimal descent with three contractions/pushing efforts. Labs: Lab Results  Component Value Date   WBC 13.7* 05/28/2012   HGB 12.9 05/28/2012   HCT 39.3 05/28/2012   MCV 79.7 05/28/2012   PLT 198 05/28/2012    Assessment / Plan: Terminal bradycardia; failed vacuum attempt  To OR for an urgent C/D  JACKSON-MOORE,Avriel Kandel A 05/28/2012, 7:05 PM

## 2012-05-28 NOTE — Anesthesia Preprocedure Evaluation (Signed)

## 2012-05-28 NOTE — Progress Notes (Signed)
Karol Acoff is a 21 y.o. G2P1001 at [redacted]w[redacted]d by LMP admitted for active labor  Subjective: Comfortable  Objective: BP 126/75  Pulse 88  Temp 98.1 F (36.7 C) (Oral)  Resp 20  Ht 4\' 11"  (1.499 m)  Wt 61.689 kg (136 lb)  BMI 27.47 kg/m2  SpO2 100%      FHT:  FHR: 140 bpm, variability: moderate,  accelerations:  Present,  decelerations:  Absent UC:   regular, every 5 minutes SVE:   Dilation: 7 Effacement (%): 90 Station: 0 Exam by:: jackson-moore, md  Labs: Lab Results  Component Value Date   WBC 13.7* 05/28/2012   HGB 12.9 05/28/2012   HCT 39.3 05/28/2012   MCV 79.7 05/28/2012   PLT 198 05/28/2012    Assessment / Plan: Protracted active phase  Labor: Augment labor w/po Cytotec Preeclampsia:  n/a Fetal Wellbeing:  Category I Pain Control:  Epidural I/D:  n/a Anticipated MOD:  NSVD  JACKSON-MOORE,Renn Stille A 05/28/2012, 5:31 PM

## 2012-05-28 NOTE — Anesthesia Postprocedure Evaluation (Signed)
  Anesthesia Post-op Note  Patient: Sandra Wolfe  Procedure(s) Performed: Procedure(s) (LRB): CESAREAN SECTION (N/A)  Patient is awake, responsive, moving her legs, and has signs of resolution of her numbness. Pain and nausea are reasonably well controlled. Vital signs are stable and clinically acceptable. Oxygen saturation is clinically acceptable. There are no apparent anesthetic complications at this time. Patient is ready for discharge.

## 2012-05-28 NOTE — Transfer of Care (Signed)
Immediate Anesthesia Transfer of Care Note  Patient: Sandra Wolfe  Procedure(s) Performed: Procedure(s) (LRB): CESAREAN SECTION (N/A)  Patient Location: PACU  Anesthesia Type: Epidural  Level of Consciousness: awake, alert  and oriented  Airway & Oxygen Therapy: Patient Spontanous Breathing  Post-op Assessment: Report given to PACU RN and Post -op Vital signs reviewed and stable  Post vital signs: stable  Complications: No apparent anesthesia complications

## 2012-05-28 NOTE — OR Nursing (Signed)
Uterus massaged by D. Val Eagle RN. Two tubes of cord blood to lab. 50cc of blood evacuated from uterus during uterine  massage.

## 2012-05-28 NOTE — H&P (Signed)
Sandra Wolfe is a 21 y.o. female presenting for contractions. Maternal Medical History:  Reason for admission: Reason for admission: contractions.  Contractions: Frequency: regular.   Perceived severity is strong.    Fetal activity: Perceived fetal activity is normal.    Prenatal complications: no prenatal complications   OB History    Grav Para Term Preterm Abortions TAB SAB Ect Mult Living   2 1 1       1      Past Medical History  Diagnosis Date  . No pertinent past medical history    Past Surgical History  Procedure Date  . No past surgeries    Family History: family history is not on file. Family history is unknown by patient. Social History:  reports that she has never smoked. She does not have any smokeless tobacco history on file. She reports that she does not drink alcohol or use illicit drugs.     Review of Systems  Constitutional: Negative for fever.  Eyes: Negative for blurred vision.  Respiratory: Negative for shortness of breath.   Gastrointestinal: Negative for vomiting.  Skin: Negative for rash.  Neurological: Negative for headaches.      Blood pressure 120/69, pulse 88, temperature 97.8 F (36.6 C), temperature source Oral, resp. rate 20, height 4\' 11"  (1.499 m), weight 61.689 kg (136 lb). Maternal Exam:  Uterine Assessment: Contraction frequency is regular.   Abdomen: Patient reports no abdominal tenderness. Introitus: not evaluated.   Cervix: Cervix evaluated by digital exam.     Fetal Exam Fetal Monitor Review: Variability: moderate (6-25 bpm).   Pattern: accelerations present and no decelerations.    Fetal State Assessment: Category I - tracings are normal.     Physical Exam  Constitutional: She appears well-developed.  HENT:  Head: Normocephalic.  Neck: Neck supple. No thyromegaly present.  Cardiovascular: Normal rate and regular rhythm.   Respiratory: Breath sounds normal.  GI: Soft. Bowel sounds are normal.  Skin: No rash noted.       Assessment/Plan: Multipara at term, active labor, Category 1 FHT Admit, anticipate an NSVD   JACKSON-MOORE,Nile Prisk A 05/28/2012, 12:49 PM

## 2012-05-28 NOTE — Op Note (Signed)
Cesarean Section Procedure Note   Sandra Wolfe   05/28/2012  Indications: Terminal bradycardia, failed vacuum attempt   Pre-operative Diagnosis: Terminal bradycardia, failed vacuum attempt.   Post-operative Diagnosis: Same   Surgeon: Antionette Char A  Assistants: none  Anesthesia: epidural  Procedure Details:  The patient was identified as Sandra Wolfe and the procedure verified as C-Section Delivery. The patient was draped and prepped in the usual sterile manner. A transverse incision was made and carried down through the subcutaneous tissue to the fascia. The fascial incision was made and extended transversely.  The rectus muscles were separated bluntly.  The peritoneum was identified and entered bluntly.    A low transverse uterine incision was made above the bladder reflection. Delivered from cephalic presentation was a living newborn female infant.  A cord ph was sent. The pH was 7.19.  The umbilical cord was clamped and cut cord. A sample was obtained for evaluation. The placenta was removed Intact and appeared normal.  The uterine incision was closed with running locked sutures of 1-0 Monocryl. A second imbricating layer of the same suture was placed.  The bladder was freed from the lower uterine segment with the bovie.  Hemostasis was observed. The paracolic gutters were irrigated.  As there was hematuria noted, the bladder was filled in retrograde fashion with sterile milk.  There was no extravasation of the milk.   The parieto peritoneum was closed in a running fashion using a suture of 2-0 Vicryl.  The fascia was then reapproximated with running sutures of 1-0Vicryl. The subcuticular closure was performed using 3-0 Monocryl.  Instrument, sponge, and needle counts were correct prior the abdominal closure and were correct at the conclusion of the case.    Findings:  See above   Estimated Blood Loss: 700 ml  Total IV Fluids:   Urine Output: 100CC OF bloody urine  Specimens:  Placenta   Complications: no complications  Disposition: PACU - hemodynamically stable.  Maternal Condition: stable   Baby condition / location:  nursery-stable    Signed: Surgeon(s): Antionette Char, MD

## 2012-05-28 NOTE — MAU Note (Signed)
"  I started contracting at about 0930 every 7-10 mins.  They are stronger and closer together now.  No bleeding or leaking of fluid. (+) FM."

## 2012-05-28 NOTE — MAU Note (Signed)
Pt reports  Having ctx since 0930 q 5 min. Denies SROM or bleeding and reports good fetal movement

## 2012-05-29 ENCOUNTER — Encounter (HOSPITAL_COMMUNITY): Payer: Self-pay | Admitting: *Deleted

## 2012-05-29 MED ORDER — PROPOFOL 10 MG/ML IV EMUL
INTRAVENOUS | Status: AC
  Filled 2012-05-29: qty 20

## 2012-05-29 NOTE — Addendum Note (Signed)
Addendum  created 05/29/12 0819 by Shanon Payor, CRNA   Modules edited:Notes Section

## 2012-05-29 NOTE — Progress Notes (Signed)
Subjective: Postpartum Day 1: Cesarean Delivery Patient reports incisional pain, tolerating PO and no problems voiding.    Objective: Vital signs in last 24 hours: Temp:  [97.7 F (36.5 C)-98.6 F (37 C)] 98.2 F (36.8 C) (07/29 0714) Pulse Rate:  [68-110] 77  (07/29 0819) Resp:  [16-20] 20  (07/29 0714) BP: (89-126)/(46-86) 92/52 mmHg (07/29 0819) SpO2:  [95 %-100 %] 99 % (07/29 0520) Weight:  [61.689 kg (136 lb)] 61.689 kg (136 lb) (07/28 1139)  Physical Exam:  General: alert and no distress Lochia: appropriate Uterine Fundus: firm Incision: healing well DVT Evaluation: No evidence of DVT seen on physical exam.   Basename 05/29/12 0525 05/28/12 1230  HGB 8.5* 12.9  HCT -- 39.3    Assessment/Plan: Status post Cesarean section. Doing well postoperatively.  Continue current care.  Zarie Kosiba A 05/29/2012, 9:00 AM

## 2012-05-29 NOTE — Progress Notes (Signed)
Ur chart review completed.  

## 2012-05-29 NOTE — Anesthesia Postprocedure Evaluation (Signed)
  Anesthesia Post-op Note  Patient: Sandra Wolfe  Procedure(s) Performed: Procedure(s) (LRB): CESAREAN SECTION (N/A)  Patient Location: PACU and Mother/Baby  Anesthesia Type: Epidural  Level of Consciousness: awake, alert  and oriented  Airway and Oxygen Therapy: Patient Spontanous Breathing  Post-op Pain: none  Post-op Assessment: Post-op Vital signs reviewed and Patient's Cardiovascular Status Stable  Post-op Vital Signs: Reviewed and stable  Complications: No apparent anesthesia complications

## 2012-05-30 NOTE — Progress Notes (Signed)
Subjective: Postpartum Day 2: Cesarean Delivery Patient reports tolerating PO, + flatus and no problems voiding.    Objective: Vital signs in last 24 hours: Temp:  [97.7 F (36.5 C)-98.4 F (36.9 C)] 97.7 F (36.5 C) (07/30 0526) Pulse Rate:  [67-78] 67  (07/30 0625) Resp:  [17-18] 17  (07/30 0526) BP: (80-98)/(49-54) 87/49 mmHg (07/30 1610)  Physical Exam:  General: alert and no distress Lochia: appropriate Uterine Fundus: firm Incision: healing well DVT Evaluation: No evidence of DVT seen on physical exam.   Basename 05/29/12 0525 05/28/12 1230  HGB 8.5* 12.9  HCT -- 39.3    Assessment/Plan: Status post Cesarean section. Doing well postoperatively.  Continue current care.  Laron Boorman A 05/30/2012, 8:14 AM

## 2012-05-30 NOTE — Progress Notes (Signed)
Entered pt room to find pt asleep in bed with baby on pillow asleep beside her.  Reminded pt that baby should be in crib when she (mother) is asleep to prevent baby falling or suffocating.  Moved baby to crib.  Pt nodded in understanding.

## 2012-05-31 MED ORDER — OXYCODONE-ACETAMINOPHEN 5-325 MG PO TABS: 1.0000 | ORAL_TABLET | ORAL | Status: AC | PRN

## 2012-05-31 MED ORDER — IBUPROFEN 600 MG PO TABS: 600.0000 mg | ORAL_TABLET | Freq: Four times a day (QID) | ORAL | Status: AC

## 2012-05-31 NOTE — Progress Notes (Signed)
Subjective: Postpartum Day 3: Cesarean Delivery Patient reports tolerating PO, + flatus, + BM and no problems voiding.    Objective: Vital signs in last 24 hours: Temp:  [98 F (36.7 C)-98.5 F (36.9 C)] 98.5 F (36.9 C) (07/31 0532) Pulse Rate:  [73-83] 83  (07/31 0532) Resp:  [18] 18  (07/31 0532) BP: (99-118)/(55-87) 118/87 mmHg (07/31 0532) SpO2:  [100 %] 100 % (07/30 1314)  Physical Exam:  General: alert and no distress Lochia: appropriate Uterine Fundus: firm Incision: healing well DVT Evaluation: No evidence of DVT seen on physical exam.   Basename 05/29/12 0525 05/28/12 1230  HGB 8.5* 12.9  HCT -- 39.3    Assessment/Plan: Status post Cesarean section. Doing well postoperatively.  Discharge home with standard precautions and return to clinic in 4-6 weeks.  Sandra Wolfe A 05/31/2012, 8:29 AM

## 2012-05-31 NOTE — Discharge Summary (Signed)
Obstetric Discharge Summary Reason for Admission: onset of labor Prenatal Procedures: ultrasound Intrapartum Procedures: cesarean: low cervical, transverse Postpartum Procedures: none Complications-Operative and Postpartum: none Hemoglobin  Date Value Range Status  05/29/2012 8.5* 12.0 - 15.0 g/dL Final     DELTA CHECK NOTED     REPEATED TO VERIFY     HCT  Date Value Range Status  05/28/2012 39.3  36.0 - 46.0 % Final    Physical Exam:  General: alert and no distress Lochia: appropriate Uterine Fundus: firm Incision: healing well DVT Evaluation: No evidence of DVT seen on physical exam.  Discharge Diagnoses: Term Pregnancy-delivered  Discharge Information: Date: 05/31/2012 Activity: pelvic rest Diet: routine Medications: PNV, Ibuprofen, Colace and Percocet Condition: stable Instructions: refer to practice specific booklet Discharge to: home Follow-up Information    Follow up with Kristjan Derner A, MD. Schedule an appointment as soon as possible for a visit in 2 weeks.   Contact information:   [address] [place] [address] [phone]          Newborn Data: Live born female  Birth Weight: 6 lb 8.6 oz (2965 g) APGAR: 9, 10  Home with mother.  Keymarion Bearman A 05/31/2012, 8:36 AM

## 2014-09-02 ENCOUNTER — Encounter (HOSPITAL_COMMUNITY): Payer: Self-pay | Admitting: *Deleted

## 2017-12-08 ENCOUNTER — Ambulatory Visit (HOSPITAL_COMMUNITY)
Admission: EM | Admit: 2017-12-08 | Discharge: 2017-12-08 | Disposition: A | Discharge status: Home / Self Care | Payer: 59 | Attending: Family Medicine | Admitting: Family Medicine

## 2017-12-08 ENCOUNTER — Encounter (HOSPITAL_COMMUNITY): Payer: Self-pay | Admitting: Family Medicine

## 2017-12-08 DIAGNOSIS — B9689 Other specified bacterial agents as the cause of diseases classified elsewhere: Secondary | ICD-10-CM

## 2017-12-08 DIAGNOSIS — J208 Acute bronchitis due to other specified organisms: Secondary | ICD-10-CM | POA: Diagnosis not present

## 2017-12-08 MED ORDER — CEFDINIR 300 MG PO CAPS: 300.0000 mg | ORAL_CAPSULE | Freq: Two times a day (BID) | ORAL | 0 refills | Status: AC

## 2017-12-08 NOTE — ED Provider Notes (Signed)
[facility]    CSN: [id number] Arrival date & time: 12/08/17  1345     History   Chief Complaint Chief Complaint  Patient presents with  . Cough    HPI Sidney Bernet is a 27 y.o. female.   27 year old female, presenting today complaining of 3 weeks of cough productive of yellow sputum.  She denies any fever or chills.  Denies any chest pain or shortness of breath.  States that she has had some mild nasal congestion with postnasal drip.   The history is provided by the patient.  Cough  Cough characteristics:  Productive Sputum characteristics:  Yellow Severity:  Mild Onset quality:  Gradual Duration:  3 weeks Timing:  Intermittent Progression:  Unchanged Chronicity:  New Smoker: no   Context: not animal exposure and not fumes   Relieved by:  Nothing Worsened by:  Nothing Ineffective treatments:  None tried Associated symptoms: sinus congestion   Associated symptoms: no chest pain, no chills, no diaphoresis, no ear fullness, no ear pain, no eye discharge, no fever, no headaches, no myalgias, no rash, no rhinorrhea, no shortness of breath, no sore throat and no wheezing   Risk factors: no chemical exposure     Past Medical History:  Diagnosis Date  . No pertinent past medical history     There are no active problems to display for this patient.   Past Surgical History:  Procedure Laterality Date  . CESAREAN SECTION  05/28/2012   Procedure: CESAREAN SECTION;  Surgeon: Antionette CharLisa Jackson-Moore, MD;  Location: [facility];  Service: Gynecology;  Laterality: N/A;  Primary cesarean section with delivery of baby girl at [id number].   . NO PAST SURGERIES      OB History    Gravida Para Term Preterm AB Living   3 2 2     2    SAB TAB Ectopic Multiple Live Births           1       Home Medications    Prior to Admission medications   Medication Sig Start Date End Date Taking? Authorizing Provider  cefdinir (OMNICEF) 300 MG capsule Take 1 capsule (300 mg total) by mouth 2  (two) times daily for 10 days. 12/08/17 12/18/17  Blue, Olivia C, PA-C  cholecalciferol (VITAMIN D) 1000 UNITS tablet Take 1,000 Units by mouth daily.    [provider]  Prenatal Vit-Fe Fumarate-FA (PRENATAL MULTIVITAMIN) TABS Take 1 tablet by mouth daily.    [provider]    Family History History reviewed. No pertinent family history.  Social History Social History   Tobacco Use  . Smoking status: Never Smoker  Substance Use Topics  . Alcohol use: No  . Drug use: No     Allergies   Patient has no known allergies.   Review of Systems Review of Systems  Constitutional: Negative for chills, diaphoresis and fever.  HENT: Negative for ear pain, rhinorrhea and sore throat.   Eyes: Negative for pain, discharge and visual disturbance.  Respiratory: Positive for cough. Negative for shortness of breath and wheezing.   Cardiovascular: Negative for chest pain and palpitations.  Gastrointestinal: Negative for abdominal pain and vomiting.  Genitourinary: Negative for dysuria and hematuria.  Musculoskeletal: Negative for arthralgias, back pain and myalgias.  Skin: Negative for color change and rash.  Neurological: Negative for seizures, syncope and headaches.  All other systems reviewed and are negative.    Physical Exam Triage Vital Signs ED Triage Vitals [12/08/17 1523]  Enc Vitals  Group     BP 129/73     Pulse Rate 82     Resp 18     Temp 98.6 F (37 C)     Temp src      SpO2 100 %     Weight      Height      Head Circumference      Peak Flow      Pain Score      Pain Loc      Pain Edu?      Excl. in GC?    No data found.  Updated Vital Signs BP 129/73   Pulse 82   Temp 98.6 F (37 C)   Resp 18   LMP 10/31/2017   SpO2 100%   Visual Acuity Right Eye Distance:   Left Eye Distance:   Bilateral Distance:    Right Eye Near:   Left Eye Near:    Bilateral Near:     Physical Exam  Constitutional: She appears well-developed and  well-nourished. No distress.  HENT:  Head: Normocephalic and atraumatic.  Right Ear: Hearing, tympanic membrane, external ear and ear canal normal.  Left Ear: Hearing, tympanic membrane, external ear and ear canal normal.  Nose: Nose normal.  Mouth/Throat: Oropharynx is clear and moist. No oropharyngeal exudate, posterior oropharyngeal edema, posterior oropharyngeal erythema or tonsillar abscesses.  Eyes: Conjunctivae are normal.  Neck: Neck supple.  Cardiovascular: Normal rate and regular rhythm.  No murmur heard. Pulmonary/Chest: Effort normal and breath sounds normal. No stridor. No respiratory distress. She has no wheezes. She has no rhonchi. She has no rales.  Abdominal: Soft. There is no tenderness.  Musculoskeletal: She exhibits no edema.  Neurological: She is alert.  Skin: Skin is warm and dry.  Psychiatric: She has a normal mood and affect.  Nursing note and vitals reviewed.    UC Treatments / Results  Labs (all labs ordered are listed, but only abnormal results are displayed) Labs Reviewed - No data to display  EKG  EKG Interpretation None       Radiology No results found.  Procedures Procedures (including critical care time)  Medications Ordered in UC Medications - No data to display   Initial Impression / Assessment and Plan / UC Course  I have reviewed the triage vital signs and the nursing notes.  Pertinent labs & imaging results that were available during my care of the patient were reviewed by me and considered in my medical decision making (see chart for details).     Will treat for acute bacterial bronchitis due to duration of symptoms   Final Clinical Impressions(s) / UC Diagnoses   Final diagnoses:  Acute bacterial bronchitis    ED Discharge Orders        Ordered    cefdinir (OMNICEF) 300 MG capsule  2 times daily     12/08/17 1538       Controlled Substance Prescriptions [place] Controlled Substance Registry consulted? Not  Applicable   Cameron Ali 12/08/17 1884

## 2017-12-08 NOTE — ED Triage Notes (Signed)
Pt here for cough x 3 weeks and slight fever.

## 2017-12-08 NOTE — Discharge Instructions (Signed)
Antibiotics prescribed are safe in pregnancy. I would avoid cough medications until seen by OB

## 2018-01-18 LAB — OB RESULTS CONSOLE ABO/RH: RH TYPE: POSITIVE

## 2018-01-18 LAB — OB RESULTS CONSOLE HIV ANTIBODY (ROUTINE TESTING): HIV: NONREACTIVE

## 2018-01-18 LAB — OB RESULTS CONSOLE RUBELLA ANTIBODY, IGM: Rubella: IMMUNE

## 2018-01-18 LAB — OB RESULTS CONSOLE HEPATITIS B SURFACE ANTIGEN: HEP B S AG: NEGATIVE

## 2018-01-18 LAB — OB RESULTS CONSOLE GC/CHLAMYDIA
CHLAMYDIA, DNA PROBE: NEGATIVE
Gonorrhea: NEGATIVE

## 2018-01-18 LAB — OB RESULTS CONSOLE ANTIBODY SCREEN: Antibody Screen: NEGATIVE

## 2018-01-18 LAB — OB RESULTS CONSOLE RPR: RPR: NONREACTIVE

## 2018-05-04 ENCOUNTER — Encounter (HOSPITAL_COMMUNITY): Payer: Self-pay | Admitting: *Deleted

## 2018-05-04 ENCOUNTER — Inpatient Hospital Stay (HOSPITAL_COMMUNITY)
Admission: AD | Admit: 2018-05-04 | Discharge: 2018-05-04 | Disposition: A | Discharge status: Home / Self Care | Payer: 59 | Source: Ambulatory Visit | Attending: Obstetrics and Gynecology | Admitting: Obstetrics and Gynecology

## 2018-05-04 DIAGNOSIS — O479 False labor, unspecified: Secondary | ICD-10-CM

## 2018-05-04 DIAGNOSIS — Z3A27 27 weeks gestation of pregnancy: Secondary | ICD-10-CM | POA: Insufficient documentation

## 2018-05-04 DIAGNOSIS — O4702 False labor before 37 completed weeks of gestation, second trimester: Secondary | ICD-10-CM | POA: Insufficient documentation

## 2018-05-04 DIAGNOSIS — Z79899 Other long term (current) drug therapy: Secondary | ICD-10-CM | POA: Insufficient documentation

## 2018-05-04 HISTORY — DX: Other specified health status: Z78.9

## 2018-05-04 LAB — URINALYSIS, ROUTINE W REFLEX MICROSCOPIC
BILIRUBIN URINE: NEGATIVE
Glucose, UA: NEGATIVE mg/dL
Hgb urine dipstick: NEGATIVE
Ketones, ur: NEGATIVE mg/dL
NITRITE: NEGATIVE
PH: 7 (ref 5.0–8.0)
Protein, ur: NEGATIVE mg/dL
SPECIFIC GRAVITY, URINE: 1.004 — AB (ref 1.005–1.030)

## 2018-05-04 NOTE — MAU Note (Signed)
Pt reports contractions since yesterday off/on. Denies bleeding or ROM

## 2018-05-04 NOTE — Discharge Instructions (Signed)
Braxton Hicks Contractions °Contractions of the uterus can occur throughout pregnancy, but they are not always a sign that you are in labor. You may have practice contractions called Braxton Hicks contractions. These false labor contractions are sometimes confused with true labor. °What are Braxton Hicks contractions? °Braxton Hicks contractions are tightening movements that occur in the muscles of the uterus before labor. Unlike true labor contractions, these contractions do not result in opening (dilation) and thinning of the cervix. Toward the end of pregnancy (32-34 weeks), Braxton Hicks contractions can happen more often and may become stronger. These contractions are sometimes difficult to tell apart from true labor because they can be very uncomfortable. You should not feel embarrassed if you go to the hospital with false labor. °Sometimes, the only way to tell if you are in true labor is for your health care provider to look for changes in the cervix. The health care provider will do a physical exam and may monitor your contractions. If you are not in true labor, the exam should show that your cervix is not dilating and your water has not broken. °If there are other health problems associated with your pregnancy, it is completely safe for you to be sent home with false labor. You may continue to have Braxton Hicks contractions until you go into true labor. °How to tell the difference between true labor and false labor °True labor °· Contractions last 30-70 seconds. °· Contractions become very regular. °· Discomfort is usually felt in the top of the uterus, and it spreads to the lower abdomen and low back. °· Contractions do not go away with walking. °· Contractions usually become more intense and increase in frequency. °· The cervix dilates and gets thinner. °False labor °· Contractions are usually shorter and not as strong as true labor contractions. °· Contractions are usually irregular. °· Contractions  are often felt in the front of the lower abdomen and in the groin. °· Contractions may go away when you walk around or change positions while lying down. °· Contractions get weaker and are shorter-lasting as time goes on. °· The cervix usually does not dilate or become thin. °Follow these instructions at home: °· Take over-the-counter and prescription medicines only as told by your health care provider. °· Keep up with your usual exercises and follow other instructions from your health care provider. °· Eat and drink lightly if you think you are going into labor. °· If Braxton Hicks contractions are making you uncomfortable: °? Change your position from lying down or resting to walking, or change from walking to resting. °? Sit and rest in a tub of warm water. °? Drink enough fluid to keep your urine pale yellow. Dehydration may cause these contractions. °? Do slow and deep breathing several times an hour. °· Keep all follow-up prenatal visits as told by your health care provider. This is important. °Contact a health care provider if: °· You have a fever. °· You have continuous pain in your abdomen. °Get help right away if: °· Your contractions become stronger, more regular, and closer together. °· You have fluid leaking or gushing from your vagina. °· You pass blood-tinged mucus (bloody show). °· You have bleeding from your vagina. °· You have low back pain that you never had before. °· You feel your baby’s head pushing down and causing pelvic pressure. °· Your baby is not moving inside you as much as it used to. °Summary °· Contractions that occur before labor are called Braxton   Hicks contractions, false labor, or practice contractions. °· Braxton Hicks contractions are usually shorter, weaker, farther apart, and less regular than true labor contractions. True labor contractions usually become progressively stronger and regular and they become more frequent. °· Manage discomfort from Braxton Hicks contractions by  changing position, resting in a warm bath, drinking plenty of water, or practicing deep breathing. °This information is not intended to replace advice given to you by your health care provider. Make sure you discuss any questions you have with your health care provider. °Document Released: 03/03/2017 Document Revised: 03/03/2017 Document Reviewed: 03/03/2017 °Elsevier Interactive Patient Education © 2018 [employer] ° °

## 2018-05-04 NOTE — MAU Provider Note (Addendum)
History     CSN: [id number]  Arrival date and time: 05/04/18 0954   None     Chief Complaint  Patient presents with  . Contractions   HPI  Sandra Wolfe is a 27 y.o. G3P2002 at 7222w5d who presents to MAU with chief complaint of contractions q two minutes since yesterday afternoon. Patient describes pain as "intense pressure", bilateral low abdomen, does not radiate, no aggravating or alleviating factors. 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  Now unaware of contractions, denies pain, denies vaginal bleeding, leaking of fluid, fever, falls, or recent illness.  Reports she consumes approximately 6 cups of water per day.  OB History    Gravida  3   Para  2   Term  2   Preterm      AB      Living  2     SAB      TAB      Ectopic      Multiple      Live Births  2           Past Medical History:  Diagnosis Date  . Medical history non-contributory   . No pertinent past medical history     Past Surgical History:  Procedure Laterality Date  . CESAREAN SECTION  05/28/2012   Procedure: CESAREAN SECTION;  Surgeon: Antionette CharLisa Jackson-Moore, MD;  Location: [facility];  Service: Gynecology;  Laterality: N/A;  Primary cesarean section with delivery of baby girl at [id number].     History reviewed. No pertinent family history.  Social History   Tobacco Use  . Smoking status: Never Smoker  . Smokeless tobacco: Never Used  Substance Use Topics  . Alcohol use: No  . Drug use: No    Allergies: No Known Allergies  Medications Prior to Admission  Medication Sig Dispense Refill Last Dose  . cholecalciferol (VITAMIN D) 1000 UNITS tablet Take 1,000 Units by mouth daily.   Past Week at Unknown  . Prenatal Vit-Fe Fumarate-FA (PRENATAL MULTIVITAMIN) TABS Take 1 tablet by mouth daily.   Past Week at Unknown    Review of Systems  Constitutional: Negative for activity change, appetite change,  fatigue and fever.  Respiratory: Negative for chest tightness and shortness of breath.   Gastrointestinal: Positive for abdominal pain. Negative for nausea and vomiting.  Endocrine: Negative for heat intolerance.  Genitourinary: Negative for difficulty urinating, vaginal bleeding, vaginal discharge and vaginal pain.  Neurological: Negative for dizziness, syncope, weakness and headaches.   Physical Exam   Blood pressure 107/65, pulse 74, temperature 98.7 F (37.1 C), temperature source Oral, resp. rate 18, height 4\' 10"  (1.473 m), weight 133 lb (60.3 kg), last menstrual period 10/31/2017, SpO2 100 %, unknown if currently breastfeeding.  Physical Exam  Constitutional: She is oriented to person, place, and time. She appears well-developed and well-nourished.  Cardiovascular: Normal rate, regular rhythm, normal heart sounds and intact distal pulses.  Respiratory: Effort normal and breath sounds normal.  GI:  Gravid  Musculoskeletal: Normal range of motion.  Neurological: She is alert and oriented to person, place, and time. She has normal reflexes.  Skin: Skin is warm and dry.  Psychiatric: She has a normal mood and affect. Her behavior is normal. Judgment and thought content normal.   Cervix closed/thick/posterior  MAU Course  Procedures  MDM Reactive NST: baseline 140, variability appropriate for GA, 10 x10 accels,  no decels Toco: uterine irritability noted, rare contractions Closed cervix  Patient Vitals for the past 24 hrs:  BP Temp Temp src Pulse Resp SpO2 Height Weight  05/04/18 1005 107/65 98.7 F (37.1 C) Oral 74 18 100 % 4\' 10"  (1.473 m) 133 lb (60.3 kg)    Results for orders placed or performed during the hospital encounter of 05/04/18 (from the past 24 hour(s))  Urinalysis, Routine w reflex microscopic     Status: Abnormal   Collection Time: 05/04/18 10:13 AM  Result Value Ref Range   Color, Urine YELLOW YELLOW   APPearance HAZY (A) CLEAR   Specific Gravity, Urine  1.004 (L) 1.005 - 1.030   pH 7.0 5.0 - 8.0   Glucose, UA NEGATIVE NEGATIVE mg/dL   Hgb urine dipstick NEGATIVE NEGATIVE   Bilirubin Urine NEGATIVE NEGATIVE   Ketones, ur NEGATIVE NEGATIVE mg/dL   Protein, ur NEGATIVE NEGATIVE mg/dL   Nitrite NEGATIVE NEGATIVE   Leukocytes, UA LARGE (A) NEGATIVE   RBC / HPF 0-5 0 - 5 RBC/hpf   WBC, UA 11-20 0 - 5 WBC/hpf   Bacteria, UA RARE (A) NONE SEEN   Squamous Epithelial / LPF 6-10 0 - 5    Assessment and Plan  --27 y.o. [id number] at [redacted]w[redacted]d  --Sandra Wolfe contractions --Reactive NST  Presentation, clinical findings, and plan discussed with Dr. Claiborne Billings. Stable for discharge home.   Sandra Wolfe, CNM 05/04/2018, 12:41 PM

## 2018-05-05 LAB — CULTURE, OB URINE: Culture: >=100000 — AB

## 2018-05-12 ENCOUNTER — Other Ambulatory Visit: Payer: Self-pay | Admitting: Obstetrics and Gynecology

## 2018-07-10 ENCOUNTER — Telehealth (HOSPITAL_COMMUNITY): Payer: Self-pay | Admitting: *Deleted

## 2018-07-10 ENCOUNTER — Encounter (HOSPITAL_COMMUNITY): Payer: Self-pay

## 2018-07-10 NOTE — Telephone Encounter (Signed)
Preadmission screen  

## 2018-07-11 ENCOUNTER — Encounter (HOSPITAL_COMMUNITY): Payer: Self-pay

## 2018-07-11 NOTE — Op Note (Signed)
C/o leaking of fluid no bleeding.  Instructed to notify Dr Tenny Craw' office immediately.  I called the office and notified them as well.

## 2018-07-12 ENCOUNTER — Inpatient Hospital Stay (HOSPITAL_COMMUNITY): Payer: 59 | Admitting: Anesthesiology

## 2018-07-12 ENCOUNTER — Inpatient Hospital Stay (HOSPITAL_COMMUNITY)
Admission: AD | Admit: 2018-07-12 | Discharge: 2018-07-15 | DRG: 787 | Disposition: A | Discharge status: Home / Self Care | Payer: 59 | Attending: Obstetrics and Gynecology | Admitting: Obstetrics and Gynecology

## 2018-07-12 ENCOUNTER — Encounter (HOSPITAL_COMMUNITY)
Admission: AD | Disposition: A | Discharge status: Home / Self Care | Payer: Self-pay | Source: Home / Self Care | Attending: Obstetrics and Gynecology

## 2018-07-12 ENCOUNTER — Encounter (HOSPITAL_COMMUNITY): Payer: Self-pay | Admitting: *Deleted

## 2018-07-12 DIAGNOSIS — O34211 Maternal care for low transverse scar from previous cesarean delivery: Secondary | ICD-10-CM | POA: Diagnosis present

## 2018-07-12 DIAGNOSIS — Z9889 Other specified postprocedural states: Secondary | ICD-10-CM

## 2018-07-12 DIAGNOSIS — O9081 Anemia of the puerperium: Secondary | ICD-10-CM | POA: Diagnosis not present

## 2018-07-12 DIAGNOSIS — D62 Acute posthemorrhagic anemia: Secondary | ICD-10-CM | POA: Diagnosis not present

## 2018-07-12 DIAGNOSIS — Z3A37 37 weeks gestation of pregnancy: Secondary | ICD-10-CM | POA: Diagnosis not present

## 2018-07-12 DIAGNOSIS — Z3490 Encounter for supervision of normal pregnancy, unspecified, unspecified trimester: Secondary | ICD-10-CM

## 2018-07-12 LAB — TYPE AND SCREEN
ABO/RH(D): A POS
Antibody Screen: NEGATIVE

## 2018-07-12 LAB — ABO/RH: ABO/RH(D): A POS

## 2018-07-12 LAB — CBC
HCT: 34.5 % — ABNORMAL LOW (ref 36.0–46.0)
Hemoglobin: 11.1 g/dL — ABNORMAL LOW (ref 12.0–15.0)
MCH: 25.1 pg — ABNORMAL LOW (ref 26.0–34.0)
MCHC: 32.2 g/dL (ref 30.0–36.0)
MCV: 77.9 fL — AB (ref 78.0–100.0)
Platelets: 222 10*3/uL (ref 150–400)
RBC: 4.43 MIL/uL (ref 3.87–5.11)
RDW: 15 % (ref 11.5–15.5)
WBC: 13.3 10*3/uL — AB (ref 4.0–10.5)

## 2018-07-12 SURGERY — Surgical Case
Anesthesia: Epidural

## 2018-07-12 MED ORDER — MEPERIDINE HCL 25 MG/ML IJ SOLN: 6.2500 mg | INTRAMUSCULAR | Status: DC | PRN

## 2018-07-12 MED ORDER — OXYTOCIN 10 UNIT/ML IJ SOLN
INTRAVENOUS | Status: DC | PRN
  Administered 2018-07-12: 40 [IU] via INTRAVENOUS

## 2018-07-12 MED ORDER — MORPHINE SULFATE (PF) 0.5 MG/ML IJ SOLN
INTRAMUSCULAR | Status: AC
Start: 2018-07-12 — End: 2018-07-12
  Filled 2018-07-12: qty 10

## 2018-07-12 MED ORDER — ONDANSETRON HCL 4 MG/2ML IJ SOLN: 4.0000 mg | Freq: Three times a day (TID) | INTRAMUSCULAR | Status: DC | PRN

## 2018-07-12 MED ORDER — PHENYLEPHRINE 8 MG IN D5W 100 ML (0.08MG/ML) PREMIX OPTIME
INJECTION | INTRAVENOUS | Status: AC
  Filled 2018-07-12: qty 100

## 2018-07-12 MED ORDER — SCOPOLAMINE 1 MG/3DAYS TD PT72
MEDICATED_PATCH | TRANSDERMAL | Status: AC
  Filled 2018-07-12: qty 1

## 2018-07-12 MED ORDER — MISOPROSTOL 200 MCG PO TABS
ORAL_TABLET | ORAL | Status: AC
  Filled 2018-07-12: qty 4

## 2018-07-12 MED ORDER — PROMETHAZINE HCL 25 MG/ML IJ SOLN: 6.2500 mg | INTRAMUSCULAR | Status: DC | PRN

## 2018-07-12 MED ORDER — NALBUPHINE HCL 10 MG/ML IJ SOLN: 5.0000 mg | INTRAMUSCULAR | Status: DC | PRN

## 2018-07-12 MED ORDER — DEXAMETHASONE SODIUM PHOSPHATE 4 MG/ML IJ SOLN
INTRAMUSCULAR | Status: DC | PRN
  Administered 2018-07-12: 4 mg via INTRAVENOUS

## 2018-07-12 MED ORDER — PHENYLEPHRINE 8 MG IN D5W 100 ML (0.08MG/ML) PREMIX OPTIME
INJECTION | INTRAVENOUS | Status: DC | PRN
  Administered 2018-07-12: 60 ug/min via INTRAVENOUS

## 2018-07-12 MED ORDER — MISOPROSTOL 200 MCG PO TABS
800.0000 ug | ORAL_TABLET | Freq: Once | ORAL | Status: AC
  Administered 2018-07-12: 800 ug via RECTAL

## 2018-07-12 MED ORDER — NALBUPHINE HCL 10 MG/ML IJ SOLN: 5.0000 mg | Freq: Once | INTRAMUSCULAR | Status: DC | PRN

## 2018-07-12 MED ORDER — OXYCODONE HCL 5 MG/5ML PO SOLN: 5.0000 mg | Freq: Once | ORAL | Status: DC | PRN

## 2018-07-12 MED ORDER — BUPIVACAINE IN DEXTROSE 0.75-8.25 % IT SOLN
INTRATHECAL | Status: DC | PRN
  Administered 2018-07-12: 1.4 mL via INTRATHECAL

## 2018-07-12 MED ORDER — MORPHINE SULFATE (PF) 0.5 MG/ML IJ SOLN
INTRAMUSCULAR | Status: DC | PRN
  Administered 2018-07-12: 2 mg via INTRAVENOUS
  Administered 2018-07-12: 2.85 mg via INTRAVENOUS
  Administered 2018-07-12: .15 mg via INTRATHECAL

## 2018-07-12 MED ORDER — SOD CITRATE-CITRIC ACID 500-334 MG/5ML PO SOLN
30.0000 mL | Freq: Once | ORAL | Status: DC
  Filled 2018-07-12: qty 15

## 2018-07-12 MED ORDER — NALOXONE HCL 0.4 MG/ML IJ SOLN: 0.4000 mg | INTRAMUSCULAR | Status: DC | PRN

## 2018-07-12 MED ORDER — KETOROLAC TROMETHAMINE 30 MG/ML IJ SOLN: 30.0000 mg | Freq: Once | INTRAMUSCULAR | Status: AC | PRN

## 2018-07-12 MED ORDER — OXYCODONE HCL 5 MG PO TABS: 5.0000 mg | ORAL_TABLET | Freq: Once | ORAL | Status: DC | PRN

## 2018-07-12 MED ORDER — METHYLERGONOVINE MALEATE 0.2 MG/ML IJ SOLN
INTRAMUSCULAR | Status: DC | PRN
  Administered 2018-07-12: 0.2 mg via INTRAMUSCULAR

## 2018-07-12 MED ORDER — METHYLERGONOVINE MALEATE 0.2 MG/ML IJ SOLN
INTRAMUSCULAR | Status: AC
  Filled 2018-07-12: qty 1

## 2018-07-12 MED ORDER — DIPHENHYDRAMINE HCL 50 MG/ML IJ SOLN: 12.5000 mg | INTRAMUSCULAR | Status: DC | PRN

## 2018-07-12 MED ORDER — FENTANYL CITRATE (PF) 100 MCG/2ML IJ SOLN
INTRAMUSCULAR | Status: DC | PRN
  Administered 2018-07-12: 15 ug via INTRATHECAL
  Administered 2018-07-12: 85 ug via INTRAVENOUS
  Administered 2018-07-12: 100 ug via INTRAVENOUS

## 2018-07-12 MED ORDER — FENTANYL CITRATE (PF) 100 MCG/2ML IJ SOLN
INTRAMUSCULAR | Status: AC
  Filled 2018-07-12: qty 2

## 2018-07-12 MED ORDER — LACTATED RINGERS IV SOLN
INTRAVENOUS | Status: DC
  Administered 2018-07-12 (×3): via INTRAVENOUS

## 2018-07-12 MED ORDER — SODIUM CHLORIDE 0.9% FLUSH: 3.0000 mL | INTRAVENOUS | Status: DC | PRN

## 2018-07-12 MED ORDER — SOD CITRATE-CITRIC ACID 500-334 MG/5ML PO SOLN
30.0000 mL | ORAL | Status: AC
  Administered 2018-07-12: 30 mL via ORAL

## 2018-07-12 MED ORDER — ONDANSETRON HCL 4 MG/2ML IJ SOLN
INTRAMUSCULAR | Status: DC | PRN
  Administered 2018-07-12: 4 mg via INTRAVENOUS

## 2018-07-12 MED ORDER — SCOPOLAMINE 1 MG/3DAYS TD PT72
1.0000 | MEDICATED_PATCH | Freq: Once | TRANSDERMAL | Status: DC
  Administered 2018-07-12: 1.5 mg via TRANSDERMAL

## 2018-07-12 MED ORDER — LACTATED RINGERS IV SOLN
INTRAVENOUS | Status: DC | PRN
  Administered 2018-07-12: 22:00:00 via INTRAVENOUS

## 2018-07-12 MED ORDER — OXYTOCIN 10 UNIT/ML IJ SOLN
INTRAMUSCULAR | Status: AC
  Filled 2018-07-12: qty 4

## 2018-07-12 MED ORDER — CEFAZOLIN SODIUM-DEXTROSE 2-4 GM/100ML-% IV SOLN
2.0000 g | INTRAVENOUS | Status: AC
  Administered 2018-07-12: 2 g via INTRAVENOUS
  Filled 2018-07-12: qty 100

## 2018-07-12 MED ORDER — HYDROMORPHONE HCL 1 MG/ML IJ SOLN
0.2500 mg | INTRAMUSCULAR | Status: DC | PRN
  Administered 2018-07-13: 0.5 mg via INTRAVENOUS

## 2018-07-12 MED ORDER — DEXAMETHASONE SODIUM PHOSPHATE 4 MG/ML IJ SOLN
INTRAMUSCULAR | Status: AC
  Filled 2018-07-12: qty 2

## 2018-07-12 MED ORDER — ONDANSETRON HCL 4 MG/2ML IJ SOLN
INTRAMUSCULAR | Status: AC
  Filled 2018-07-12: qty 2

## 2018-07-12 MED ORDER — FAMOTIDINE IN NACL 20-0.9 MG/50ML-% IV SOLN
20.0000 mg | Freq: Once | INTRAVENOUS | Status: AC
  Administered 2018-07-12: 20 mg via INTRAVENOUS
  Filled 2018-07-12: qty 50

## 2018-07-12 MED ORDER — NALOXONE HCL 4 MG/10ML IJ SOLN
1.0000 ug/kg/h | INTRAVENOUS | Status: DC | PRN
  Filled 2018-07-12: qty 5

## 2018-07-12 MED ORDER — DIPHENHYDRAMINE HCL 25 MG PO CAPS
25.0000 mg | ORAL_CAPSULE | ORAL | Status: DC | PRN
  Filled 2018-07-12: qty 1

## 2018-07-12 SURGICAL SUPPLY — 48 items
ADH SKN CLS APL DERMABOND .7 (GAUZE/BANDAGES/DRESSINGS)
APL SKNCLS STERI-STRIP NONHPOA (GAUZE/BANDAGES/DRESSINGS) ×1
BENZOIN TINCTURE PRP APPL 2/3 (GAUZE/BANDAGES/DRESSINGS) ×1 IMPLANT
CHLORAPREP W/TINT 26ML (MISCELLANEOUS) ×2 IMPLANT
CLAMP CORD UMBIL (MISCELLANEOUS) ×1 IMPLANT
CLOTH BEACON ORANGE TIMEOUT ST (SAFETY) ×2 IMPLANT
CLSR STERI-STRIP ANTIMIC 1/2X4 (GAUZE/BANDAGES/DRESSINGS) ×1 IMPLANT
DERMABOND ADVANCED (GAUZE/BANDAGES/DRESSINGS)
DERMABOND ADVANCED .7 DNX12 (GAUZE/BANDAGES/DRESSINGS) IMPLANT
DRSG OPSITE POSTOP 4X10 (GAUZE/BANDAGES/DRESSINGS) ×2 IMPLANT
ELECT REM PT RETURN 9FT ADLT (ELECTROSURGICAL) ×2
ELECTRODE REM PT RTRN 9FT ADLT (ELECTROSURGICAL) ×1 IMPLANT
EXTRACTOR VACUUM M CUP 4 TUBE (SUCTIONS) IMPLANT
GAUZE SPONGE 4X4 12PLY STRL LF (GAUZE/BANDAGES/DRESSINGS) ×2 IMPLANT
GLOVE BIO SURGEON STRL SZ7 (GLOVE) ×2 IMPLANT
GLOVE BIOGEL PI IND STRL 7.0 (GLOVE) ×1 IMPLANT
GLOVE BIOGEL PI IND STRL 8 (GLOVE) IMPLANT
GLOVE BIOGEL PI INDICATOR 7.0 (GLOVE) ×1
GLOVE BIOGEL PI INDICATOR 8 (GLOVE) ×1
GLOVE ECLIPSE 8.0 STRL XLNG CF (GLOVE) ×3 IMPLANT
GOWN SRG XL LVL 4 BRTHBL STRL (GOWNS) IMPLANT
GOWN STRL NON-REIN XL LVL4 (GOWNS) ×4
GOWN STRL REUS W/TWL LRG LVL3 (GOWN DISPOSABLE) ×4 IMPLANT
HEMOSTAT ARISTA ABSORB 3G PWDR (MISCELLANEOUS) ×1 IMPLANT
KIT ABG SYR 3ML LUER SLIP (SYRINGE) IMPLANT
NDL HYPO 25X5/8 SAFETYGLIDE (NEEDLE) IMPLANT
NEEDLE HYPO 22GX1.5 SAFETY (NEEDLE) IMPLANT
NEEDLE HYPO 25X5/8 SAFETYGLIDE (NEEDLE) IMPLANT
NS IRRIG 1000ML POUR BTL (IV SOLUTION) ×2 IMPLANT
PACK C SECTION WH (CUSTOM PROCEDURE TRAY) ×2 IMPLANT
PAD ABD 7.5X8 STRL (GAUZE/BANDAGES/DRESSINGS) ×1 IMPLANT
PAD OB MATERNITY 4.3X12.25 (PERSONAL CARE ITEMS) ×2 IMPLANT
PENCIL SMOKE EVAC W/HOLSTER (ELECTROSURGICAL) ×2 IMPLANT
RTRCTR C-SECT PINK 25CM LRG (MISCELLANEOUS) ×2 IMPLANT
SPONGE LAP 18X18 X RAY DECT (DISPOSABLE) ×5 IMPLANT
STAPLER VISISTAT 35W (STAPLE) ×1 IMPLANT
SUT CHROMIC 1 CTX 36 (SUTURE) ×4 IMPLANT
SUT CHROMIC 2 0 CT 1 (SUTURE) ×2 IMPLANT
SUT PDS AB 0 CTX 60 (SUTURE) ×3 IMPLANT
SUT VIC AB 2-0 CT1 27 (SUTURE) ×2
SUT VIC AB 2-0 CT1 TAPERPNT 27 (SUTURE) ×1 IMPLANT
SUT VIC AB 2-0 CTX 36 (SUTURE) ×2 IMPLANT
SUT VIC AB 3-0 SH 27 (SUTURE) ×2
SUT VIC AB 3-0 SH 27X BRD (SUTURE) IMPLANT
SUT VIC AB 4-0 KS 27 (SUTURE) IMPLANT
SYR 30ML LL (SYRINGE) IMPLANT
TOWEL OR 17X24 6PK STRL BLUE (TOWEL DISPOSABLE) ×2 IMPLANT
TRAY FOLEY W/BAG SLVR 14FR LF (SET/KITS/TRAYS/PACK) ×2 IMPLANT

## 2018-07-12 NOTE — H&P (Signed)
27 y.o. [redacted]w[redacted]d  G3P2002 comes in c/o contractions since 2am worsening over the last few hours and now 9/10.  Otherwise has good fetal movement and no bleeding.  Past Medical History:  Diagnosis Date  . Medical history non-contributory   . No pertinent past medical history     Past Surgical History:  Procedure Laterality Date  . CESAREAN SECTION  05/28/2012   Procedure: CESAREAN SECTION;  Surgeon: Antionette Char, MD;  Location: [facility];  Service: Gynecology;  Laterality: N/A;  Primary cesarean section with delivery of baby girl at 30.     OB History  Gravida Para Term Preterm AB Living  3 2 2     2   SAB TAB Ectopic Multiple Live Births          2    # Outcome Date GA Lbr Len/2nd Weight Sex Delivery Anes PTL Lv  3 Current           2 Term 05/28/12 106w2d 08:24 / 00:19 2965 g F CS-LTranv EPI  LIV     Birth Comments: NBS--Normal, HB FA (MR# [id number])  1 Term 2011   3175 g  Vag-Spont   LIV    Social History   Socioeconomic History  . Marital status: Married    Spouse name: Not on file  . Number of children: Not on file  . Years of education: Not on file  . Highest education level: Not on file  Occupational History  . Not on file  Social Needs  . Financial resource strain: Not hard at all  . Food insecurity:    Worry: Never true    Inability: Never true  . Transportation needs:    Medical: No    Non-medical: Not on file  Tobacco Use  . Smoking status: Never Smoker  . Smokeless tobacco: Never Used  Substance and Sexual Activity  . Alcohol use: No  . Drug use: No  . Sexual activity: Yes    Birth control/protection: None  Lifestyle  . Physical activity:    Days per week: 0 days    Minutes per session: 0 min  . Stress: Not on file  Relationships  . Social connections:    Talks on phone: Not on file    Gets together: Not on file    Attends religious service: Not on file    Active member of club or organization: Not on file    Attends meetings of clubs or  organizations: Not on file    Relationship status: Not on file  . Intimate partner violence:    Fear of current or ex partner: No    Emotionally abused: No    Physically abused: No    Forced sexual activity: No  Other Topics Concern  . Not on file  Social History Narrative  . Not on file   Patient has no known allergies.    Prenatal Transfer Tool  Maternal Diabetes: No Genetic Screening: Declined Maternal Ultrasounds/Referrals: Normal Fetal Ultrasounds or other Referrals:  None Maternal Substance Abuse:  No Significant Maternal Medications:  None Significant Maternal Lab Results: None GBS neg  Other PNC: uncomplicated.    Vitals:   07/12/18 1849  BP: 122/73  Pulse: (!) 104  Resp: 20  Temp: 98.5 F (36.9 C)  TempSrc: Oral  SpO2: 99%  Weight: 66.8 kg  Height: 4\' 10"  (1.473 m)    Lungs/Cor:  NAD Abdomen:  soft, gravid Ex:  no cords, erythema SVE:  4/100/-1 with bulging bag per MAU (  changed from 2cm in office) FHTs: 130, good STV, NST R; Cat 1 tracing. Toco:  q 2-4   A/P   Admit in active labor  GBS Neg  2g Anceg  SCD and other routine pre-op care  Starks, Luther Parody

## 2018-07-12 NOTE — Anesthesia Preprocedure Evaluation (Signed)
Anesthesia Evaluation  Patient identified by MRN, date of birth, ID band Patient awake    Reviewed: Allergy & Precautions, H&P , NPO status , Patient's Chart, lab work & pertinent test results  Airway Mallampati: I  TM Distance: >3 FB Neck ROM: full    Dental no notable dental hx.    Pulmonary neg pulmonary ROS,    Pulmonary exam normal breath sounds clear to auscultation       Cardiovascular negative cardio ROS Normal cardiovascular exam Rhythm:Regular Rate:Normal     Neuro/Psych negative neurological ROS  negative psych ROS   GI/Hepatic negative GI ROS, Neg liver ROS,   Endo/Other  negative endocrine ROS  Renal/GU negative Renal ROS  negative genitourinary   Musculoskeletal negative musculoskeletal ROS (+)   Abdominal Normal abdominal exam  (+)   Peds negative pediatric ROS (+)  Hematology negative hematology ROS (+)   Anesthesia Other Findings   Reproductive/Obstetrics (+) Pregnancy                             Anesthesia Physical  Anesthesia Plan  ASA: II and emergent  Anesthesia Plan: Epidural   Post-op Pain Management:    Induction:   PONV Risk Score and Plan: 2 and Treatment may vary due to age or medical condition  Airway Management Planned: Natural Airway  Additional Equipment:   Intra-op Plan:   Post-operative Plan:   Informed Consent: I have reviewed the patients History and Physical, chart, labs and discussed the procedure including the risks, benefits and alternatives for the proposed anesthesia with the patient or authorized representative who has indicated his/her understanding and acceptance.     Plan Discussed with:   Anesthesia Plan Comments:         Anesthesia Quick Evaluation

## 2018-07-12 NOTE — Anesthesia Procedure Notes (Signed)
Spinal  Patient location during procedure: OB Start time: 07/12/2018 9:23 PM End time: 07/12/2018 9:28 PM Staffing Anesthesiologist: Lowella Curb, MD Performed: anesthesiologist  Preanesthetic Checklist Completed: patient identified, surgical consent, pre-op evaluation, timeout performed, IV checked, risks and benefits discussed and monitors and equipment checked Spinal Block Patient position: sitting Prep: site prepped and draped and DuraPrep Patient monitoring: heart rate, cardiac monitor, continuous pulse ox and blood pressure Approach: midline Location: L3-4 Injection technique: single-shot Needle Needle type: Pencan  Needle gauge: 24 G Needle length: 10 cm Assessment Sensory level: T4

## 2018-07-12 NOTE — Progress Notes (Signed)
Dr. Claiborne Billings in a delivery. Will call back

## 2018-07-12 NOTE — Consult Note (Signed)
[facility]  Delivery Note:  C-section       07/12/2018  9:44 PM  I was called to the operating room at the request of the patient's obstetrician (Dr. Callahan) for a repeat c-section.  PRENATAL HX:  This is a 27 y/o G3P2002 at 37 and 4/[redacted] weeks gestation who was admitted in active labor.  No noted pregnancy complications.  Delivery by repeat c-section with AROM at delivery.    DELIVERY:  Infant had poor tone at delivery, but had spontaneous respirations and HR > 100, and she responded well to standard warming, drying and stimulation.  APGARs 6 and 8.  Hypotonia gradually improving and exam otherwise within normal limits.  After 10 minutes, baby left with nurse to assist parents with skin-to-skin care.   _____________________ Electronically Signed By: Whitni Pasquini, MD Neonatologist  

## 2018-07-12 NOTE — Transfer of Care (Signed)
Immediate Anesthesia Transfer of Care Note  Patient: Sandra Wolfe  Procedure(s) Performed: CESAREAN SECTION (N/A )  Patient Location: PACU  Anesthesia Type:Spinal  Level of Consciousness: awake  Airway & Oxygen Therapy: Patient Spontanous Breathing  Post-op Assessment: Report given to RN and Post -op Vital signs reviewed and stable  Post vital signs: stable  Last Vitals:  Vitals Value Taken Time  BP 131/120 07/12/2018 11:36 PM  Temp    Pulse 88 07/12/2018 11:38 PM  Resp 20 07/12/2018 11:38 PM  SpO2 99 % 07/12/2018 11:38 PM  Vitals shown include unvalidated device data.  Last Pain:  Vitals:   07/12/18 1849  TempSrc: Oral  PainSc:          Complications: No apparent anesthesia complications

## 2018-07-12 NOTE — Op Note (Signed)
07/12/2018  11:12 PM  PATIENT:  Sandra Wolfe  27 y.o. female  PRE-OPERATIVE DIAGNOSIS:  REPEAT c/s, declines TOLAC EDD: 07/31/18 NKDA  POST-OPERATIVE DIAGNOSIS:  REPEAT c/s declines TOLAC EDD: 07/31/18 NKDA  PROCEDURE:  Repeat cesarean section, lysis of adhesions  SURGEON:  Surgeon(s) and Role:    Claiborne Billings, Luther Parody, DO - Primary    * Lazaro Arms, MD - Assisting   ANESTHESIA:   spinal  FINDINGS: female infant, cephalic presentation, APGARS 6/8, weight pending, normal tubes and ovaries bilaterally palpated, significant scar tissue of anterior abdominal wall making layers indistinct, and adhesions  from uterine serosa to anterior abd wall and bladder to lower uterine segment, Arista used over incision and serosa and anterior muscle prophylactically given friable tissues.  EBL:  1445 mL   SPECIMEN:  Source of Specimen:  cord blood  DISPOSITION OF SPECIMEN:  N/A  COUNTS:  YES  PLAN OF CARE: Admit to inpatient   PATIENT DISPOSITION:  PACU - hemodynamically stable.   Delay start of Pharmacological VTE agent (>24hrs) due to surgical blood loss or risk of bleeding: not applicable

## 2018-07-12 NOTE — Progress Notes (Addendum)
Called to OR my Rn stating increased bleeding from vagina.  On exam 200cc of clot and some trickling of blood noted.  Fundus firm and two fingers below umbilicus.  Anesthesia administered 0.2mg  methergine and of cytotec placed rectally prophylactically.  No heavy bleeding noted.

## 2018-07-12 NOTE — MAU Note (Signed)
Started during the night, getting closer and stronger. Was 2 cm yesterday. No bleeding or water leaking. For repeat c/s

## 2018-07-13 ENCOUNTER — Encounter (HOSPITAL_COMMUNITY): Payer: Self-pay

## 2018-07-13 LAB — CBC
HCT: 25.5 % — ABNORMAL LOW (ref 36.0–46.0)
Hemoglobin: 8.3 g/dL — ABNORMAL LOW (ref 12.0–15.0)
MCH: 25.1 pg — AB (ref 26.0–34.0)
MCHC: 32.2 g/dL (ref 30.0–36.0)
MCV: 78 fL (ref 78.0–100.0)
PLATELETS: 155 10*3/uL (ref 150–400)
RBC: 3.27 MIL/uL — ABNORMAL LOW (ref 3.87–5.11)
RDW: 14.9 % (ref 11.5–15.5)
WBC: 10.8 10*3/uL — ABNORMAL HIGH (ref 4.0–10.5)

## 2018-07-13 LAB — RPR: RPR Ser Ql: NONREACTIVE

## 2018-07-13 MED ORDER — SIMETHICONE 80 MG PO CHEW: 80.0000 mg | CHEWABLE_TABLET | ORAL | Status: DC | PRN

## 2018-07-13 MED ORDER — COCONUT OIL OIL: 1.0000 "application " | TOPICAL_OIL | Status: DC | PRN

## 2018-07-13 MED ORDER — DIBUCAINE 1 % RE OINT: 1.0000 "application " | TOPICAL_OINTMENT | RECTAL | Status: DC | PRN

## 2018-07-13 MED ORDER — ZOLPIDEM TARTRATE 5 MG PO TABS: 5.0000 mg | ORAL_TABLET | Freq: Every evening | ORAL | Status: DC | PRN

## 2018-07-13 MED ORDER — IBUPROFEN 600 MG PO TABS
ORAL_TABLET | ORAL | Status: AC
  Filled 2018-07-13: qty 1

## 2018-07-13 MED ORDER — SIMETHICONE 80 MG PO CHEW
80.0000 mg | CHEWABLE_TABLET | Freq: Three times a day (TID) | ORAL | Status: DC
  Administered 2018-07-13 – 2018-07-15 (×6): 80 mg via ORAL
  Filled 2018-07-13 (×6): qty 1

## 2018-07-13 MED ORDER — ACETAMINOPHEN 325 MG PO TABS
650.0000 mg | ORAL_TABLET | ORAL | Status: DC | PRN
  Administered 2018-07-13: 650 mg via ORAL

## 2018-07-13 MED ORDER — SIMETHICONE 80 MG PO CHEW
80.0000 mg | CHEWABLE_TABLET | ORAL | Status: DC
  Administered 2018-07-14 (×2): 80 mg via ORAL
  Filled 2018-07-13 (×2): qty 1

## 2018-07-13 MED ORDER — OXYCODONE-ACETAMINOPHEN 5-325 MG PO TABS
2.0000 | ORAL_TABLET | ORAL | Status: DC | PRN
  Administered 2018-07-14: 2 via ORAL
  Filled 2018-07-13: qty 2

## 2018-07-13 MED ORDER — SENNOSIDES-DOCUSATE SODIUM 8.6-50 MG PO TABS
2.0000 | ORAL_TABLET | ORAL | Status: DC
  Administered 2018-07-14 (×2): 2 via ORAL
  Filled 2018-07-13 (×2): qty 2

## 2018-07-13 MED ORDER — HYDROMORPHONE HCL 1 MG/ML IJ SOLN
INTRAMUSCULAR | Status: AC
  Filled 2018-07-13: qty 0.5

## 2018-07-13 MED ORDER — OXYCODONE-ACETAMINOPHEN 5-325 MG PO TABS: 1.0000 | ORAL_TABLET | ORAL | Status: DC | PRN

## 2018-07-13 MED ORDER — PRENATAL MULTIVITAMIN CH
1.0000 | ORAL_TABLET | Freq: Every day | ORAL | Status: DC
  Administered 2018-07-13 – 2018-07-14 (×2): 1 via ORAL
  Filled 2018-07-13 (×2): qty 1

## 2018-07-13 MED ORDER — DIPHENHYDRAMINE HCL 25 MG PO CAPS: 25.0000 mg | ORAL_CAPSULE | Freq: Four times a day (QID) | ORAL | Status: DC | PRN

## 2018-07-13 MED ORDER — ACETAMINOPHEN 325 MG PO TABS
ORAL_TABLET | ORAL | Status: AC
  Filled 2018-07-13: qty 2

## 2018-07-13 MED ORDER — TETANUS-DIPHTH-ACELL PERTUSSIS 5-2.5-18.5 LF-MCG/0.5 IM SUSP: 0.5000 mL | Freq: Once | INTRAMUSCULAR | Status: DC

## 2018-07-13 MED ORDER — OXYTOCIN 40 UNITS IN LACTATED RINGERS INFUSION - SIMPLE MED: 2.5000 [IU]/h | INTRAVENOUS | Status: AC

## 2018-07-13 MED ORDER — LACTATED RINGERS IV SOLN
INTRAVENOUS | Status: DC
  Administered 2018-07-13: 07:00:00 via INTRAVENOUS

## 2018-07-13 MED ORDER — MENTHOL 3 MG MT LOZG: 1.0000 | LOZENGE | OROMUCOSAL | Status: DC | PRN

## 2018-07-13 MED ORDER — WITCH HAZEL-GLYCERIN EX PADS: 1.0000 "application " | MEDICATED_PAD | CUTANEOUS | Status: DC | PRN

## 2018-07-13 MED ORDER — IBUPROFEN 600 MG PO TABS
600.0000 mg | ORAL_TABLET | Freq: Four times a day (QID) | ORAL | Status: DC
  Administered 2018-07-13: 600 mg via ORAL
  Administered 2018-07-13: 03:00:00 via ORAL
  Administered 2018-07-13 – 2018-07-15 (×7): 600 mg via ORAL
  Filled 2018-07-13 (×8): qty 1

## 2018-07-13 NOTE — Lactation Note (Signed)
This note was copied from a baby's chart. Lactation Consultation Note  Patient Name: Sandra Wolfe Ayo UUVOZ'DToday's Date: 07/13/2018 Reason for consult: Initial assessment;Early term 1637-38.6wks  P3 mother whose infant is now 6711 hours old.  This is an ETI at 37+4 weeks.  Baby was doing STS with mother and RN in room to assess temperature of 97.5 degrees.  Mother will continue STS.  Mother had previously requested formula to supplement baby until her milk "came in."  However, after speaking with her she has now decided to try breastfeeding here in the hospital.  I encouraged her not to give any formula but to allow the RN/LC to help with latching and to answer any questions she may have.    Encouraged to feed 8-12 times/24 hours or sooner if baby shows feeding cues.  Reviewed feeding cues.  Explained how doing hand expression before/after feedings can help increase milk supply.  Mother is familiar with hand expression and will feed back any EBM she obtains with hand expression.  Spoon provided.    Mom made aware of O/P services, breastfeeding support groups, community resources, and our phone # for post-discharge questions. Mother will observe baby for feeding cues and call for help as needed.  Visitor present.  RN in room and aware that no formula will be used at this time.  Mother understands she can use her EBM for supplementation.   Maternal Data Formula Feeding for Exclusion: No Has patient been taught Hand Expression?: Yes  Feeding Feeding Type: Bottle Fed - Breast Milk Length of feed: 0 min  LATCH Score                   Interventions    Lactation Tools Discussed/Used WIC Program: No   Consult Status Consult Status: Follow-up Date: 07/14/18 Follow-up type: In-patient    Gillie Crisci R Tajee Savant 07/13/2018, 9:44 AM

## 2018-07-13 NOTE — Progress Notes (Signed)
Patient is doing well.  She is tolerating PO, ambulating.  Her foley catheter has not yet been removed.  Pain is controlled.  Lochia is appropriate.    Patient had significant adhesions during RCS with an EBL of 1445 mL.  She has ambulated without dizziness since surgery.    Vitals:   07/13/18 0200 07/13/18 0310 07/13/18 0400 07/13/18 0800  BP: 114/65 110/72 (!) 103/58   Pulse: 84 87 86   Resp: 18 16 16 18   Temp: 98 F (36.7 C) 98 F (36.7 C) 98.8 F (37.1 C) 98.4 F (36.9 C)  TempSrc: Oral Oral Oral Oral  SpO2: 98% 99% 100% 100%  Weight:      Height:        NAD Abdomen:  soft, appropriate tenderness.  Pressure dressing in place ext:    Symmetric, 1+ edema bilaterally, SCDs  Lab Results  Component Value Date   WBC 10.8 (H) 07/13/2018   HGB 8.3 (L) 07/13/2018   HCT 25.5 (L) 07/13/2018   MCV 78.0 07/13/2018   PLT 155 07/13/2018    --/--/A POS (09/11 2019)/RImmune  A/P    27 y.o. G3P3003 POD 1 s/p RCS ABLA--hgb 8.3 from 11.  Is asymptomatic.  Will d/c on po iron Remove pressure dressing today with shower. Routine post op and postpartum care.

## 2018-07-13 NOTE — Anesthesia Postprocedure Evaluation (Signed)
Anesthesia Post Note  Patient: Sandra Wolfe  Procedure(s) Performed: CESAREAN SECTION (N/A )     Patient location during evaluation: Mother Baby Anesthesia Type: Epidural Level of consciousness: awake and alert Pain management: pain level controlled Vital Signs Assessment: post-procedure vital signs reviewed and stable Respiratory status: spontaneous breathing, nonlabored ventilation and respiratory function stable Cardiovascular status: stable Postop Assessment: no headache, no backache and epidural receding Anesthetic complications: no    Last Vitals:  Vitals:   07/13/18 0015 07/13/18 0030  BP: 96/66   Pulse: 89   Resp: 18   Temp:  37.5 C  SpO2: 98%     Last Pain:  Vitals:   07/13/18 0030  TempSrc:   PainSc: 0-No pain   Pain Goal:                 Lowella CurbWarren Ray Miller

## 2018-07-14 ENCOUNTER — Encounter (HOSPITAL_COMMUNITY): Payer: Self-pay | Admitting: Obstetrics and Gynecology

## 2018-07-14 MED ORDER — OXYCODONE-ACETAMINOPHEN 5-325 MG PO TABS: 1.0000 | ORAL_TABLET | ORAL | 0 refills | Status: AC | PRN

## 2018-07-14 MED ORDER — FERROUS SULFATE 325 (65 FE) MG PO TABS
325.0000 mg | ORAL_TABLET | Freq: Two times a day (BID) | ORAL | Status: DC
  Administered 2018-07-14 – 2018-07-15 (×3): 325 mg via ORAL
  Filled 2018-07-14 (×3): qty 1

## 2018-07-14 MED ORDER — BISACODYL 10 MG RE SUPP
10.0000 mg | Freq: Once | RECTAL | Status: AC
  Administered 2018-07-14: 10 mg via RECTAL
  Filled 2018-07-14: qty 1

## 2018-07-14 NOTE — Progress Notes (Signed)
Patient took off the pressure dressing after shower and noticed the honeycomb dressing was soaking wet.  Dr. Chestine Sporelark notified- ordered to take off the honeycomb dressing and leave the incision open to air.  MD will assess the incision site this morning.  No active bleeding observed, steri strips in place.

## 2018-07-14 NOTE — Lactation Note (Signed)
This note was copied from a baby's chart. Lactation Consultation Note  Patient Name: Girl Payton SparkHMen Buckholtz NWGNF'AToday's Date: 07/14/2018 Reason for consult: Follow-up assessment Mom states she chooses to use manual pump and supplement with formula.  Mom does not want to put baby to breast.  Offered DEBP but mom states she prefers the manual.  Encouraged to call for assist/concerns  Maternal Data    Feeding    LATCH Score                   Interventions    Lactation Tools Discussed/Used     Consult Status Consult Status: PRN    Huston FoleyMOULDEN, Kashayla Ungerer S 07/14/2018, 3:20 PM

## 2018-07-14 NOTE — Progress Notes (Signed)
  Patient is eating, ambulating, voiding.  Pain control is good.  Vitals:   07/13/18 1529 07/13/18 1651 07/13/18 2056 07/14/18 0641  BP:  (!) 94/49 (!) 91/48 (!) 105/57  Pulse:  90 97 97  Resp:  18 16 18   Temp:  97.6 F (36.4 C) 98.7 F (37.1 C) 98.4 F (36.9 C)  TempSrc:  Oral Oral Oral  SpO2: 100% 98%  98%  Weight:      Height:        lungs:   clear to auscultation cor:    RRR Abdomen:  soft, appropriate tenderness, incisions intact and without erythema or exudate ex:    no cords   Lab Results  Component Value Date   WBC 10.8 (H) 07/13/2018   HGB 8.3 (L) 07/13/2018   HCT 25.5 (L) 07/13/2018   MCV 78.0 07/13/2018   PLT 155 07/13/2018    --/--/A POS (09/11 2019)/RI  A/P    Post operative day 1.  Routine post op and postpartum care.  Expect d/c tomorrow.  Percocet for pain control. PP hemorrhage- pt stable and will add iron po.

## 2018-07-14 NOTE — Discharge Summary (Signed)
Obstetric Discharge Summary Reason for Admission: cesarean section Prenatal Procedures: none Intrapartum Procedures: cesarean: low cervical, transverse Postpartum Procedures: none Complications-Operative and Postpartum: hemorrhage Hemoglobin  Date Value Ref Range Status  07/13/2018 8.3 (L) 12.0 - 15.0 g/dL Final    Comment:    REPEATED TO VERIFY DELTA CHECK NOTED    HCT  Date Value Ref Range Status  07/13/2018 25.5 (L) 36.0 - 46.0 % Final     Discharge Diagnoses: Term Pregnancy-delivered  Discharge Information: Date: 07/14/2018 Activity: pelvic rest Diet: routine Medications: Iron and Percocet Condition: stable Instructions: refer to practice specific booklet Discharge to: home   Newborn Data: Live born female  Birth Weight: 6 lb 6.3 oz (2900 g) APGAR: 6, 8  Newborn Delivery   Birth date/time:  07/12/2018 22:04:00 Delivery type:  C-Section, Low Transverse Trial of labor:  No C-section categorization:  Repeat     Home with mother.  Sandra Wolfe 07/14/2018, 3:26 PM

## 2018-07-15 ENCOUNTER — Other Ambulatory Visit: Payer: Self-pay

## 2018-07-15 NOTE — Lactation Note (Signed)
This note was copied from a baby's chart. Lactation Consultation Note  Patient Name: Sandra Wolfe ZOXWR'UToday's Date: 07/15/2018 Reason for consult: Follow-up assessment   P3, Mother is pumping with manual pump (her choice) and formula feeding. She is pumping 5-10 ml of breastmilk and giving formula with bottle. She denies questions or concerns. Mom encouraged to feed baby 8-12 times/24 hours and with feeding cues.  Reviewed engorgement care and monitoring voids/stools.     Maternal Data    Feeding Feeding Type: Bottle Fed - Breast Milk Nipple Type: Slow - flow  LATCH Score                   Interventions    Lactation Tools Discussed/Used     Consult Status Consult Status: Complete Date: 07/15/18    Dahlia ByesBerkelhammer, Ruth [facility] 07/15/2018, 9:18 AM

## 2018-07-15 NOTE — Progress Notes (Addendum)
  Patient is eating, ambulating, voiding.  Pain control is good.  Vitals:   07/14/18 1032 07/14/18 1630 07/14/18 2106 07/15/18 0609  BP: 110/75 109/62 104/62 92/60  Pulse: 96 99 100 88  Resp: (!) 22 19 18 18   Temp: 98.2 F (36.8 C) 98.3 F (36.8 C) 97.7 F (36.5 C) 98 F (36.7 C)  TempSrc: Oral  Oral Oral  SpO2: 100%  100%   Weight:      Height:        lungs:   clear to auscultation cor:    RRR Abdomen:  soft, appropriate tenderness, incisions intact and without erythema or exudate ex:    no cords   Lab Results  Component Value Date   WBC 10.8 (H) 07/13/2018   HGB 8.3 (L) 07/13/2018   HCT 25.5 (L) 07/13/2018   MCV 78.0 07/13/2018   PLT 155 07/13/2018    --/--/A POS (09/11 2019)/[place]  A/P    Post operative day 2.  Routine post op and postpartum care.  Expect d/c today.  Percocet for pain control.  Stable anemia, iron.

## 2018-07-22 ENCOUNTER — Encounter (HOSPITAL_COMMUNITY): Payer: Self-pay | Admitting: *Deleted

## 2018-07-22 ENCOUNTER — Inpatient Hospital Stay (HOSPITAL_COMMUNITY)
Admission: AD | Admit: 2018-07-22 | Discharge: 2018-07-22 | Disposition: A | Discharge status: Home / Self Care | Payer: 59 | Source: Ambulatory Visit | Attending: Obstetrics and Gynecology | Admitting: Obstetrics and Gynecology

## 2018-07-22 DIAGNOSIS — O9081 Anemia of the puerperium: Secondary | ICD-10-CM | POA: Insufficient documentation

## 2018-07-22 DIAGNOSIS — O165 Unspecified maternal hypertension, complicating the puerperium: Secondary | ICD-10-CM

## 2018-07-22 DIAGNOSIS — O9089 Other complications of the puerperium, not elsewhere classified: Secondary | ICD-10-CM | POA: Insufficient documentation

## 2018-07-22 DIAGNOSIS — Z4889 Encounter for other specified surgical aftercare: Secondary | ICD-10-CM

## 2018-07-22 DIAGNOSIS — D62 Acute posthemorrhagic anemia: Secondary | ICD-10-CM | POA: Diagnosis not present

## 2018-07-22 LAB — COMPREHENSIVE METABOLIC PANEL
ALBUMIN: 2.8 g/dL — AB (ref 3.5–5.0)
ALT: 25 U/L (ref 0–44)
ANION GAP: 7 (ref 5–15)
AST: 19 U/L (ref 15–41)
Alkaline Phosphatase: 88 U/L (ref 38–126)
BUN: 10 mg/dL (ref 6–20)
CHLORIDE: 109 mmol/L (ref 98–111)
CO2: 25 mmol/L (ref 22–32)
Calcium: 8.4 mg/dL — ABNORMAL LOW (ref 8.9–10.3)
Creatinine, Ser: 0.75 mg/dL (ref 0.44–1.00)
GFR calc Af Amer: >60 mL/min (ref >60)
Glucose, Bld: 95 mg/dL (ref 70–99)
Potassium: 4.5 mmol/L (ref 3.5–5.1)
Sodium: 141 mmol/L (ref 135–145)
TOTAL PROTEIN: 6.2 g/dL — AB (ref 6.5–8.1)
Total Bilirubin: 0.5 mg/dL (ref 0.3–1.2)

## 2018-07-22 LAB — PROTEIN / CREATININE RATIO, URINE
CREATININE, URINE: 44 mg/dL
PROTEIN CREATININE RATIO: 0.23 mg/mg{creat} — AB (ref 0.00–0.15)
Total Protein, Urine: 10 mg/dL

## 2018-07-22 LAB — CBC
HEMATOCRIT: 21.5 % — AB (ref 36.0–46.0)
Hemoglobin: 6.8 g/dL — CL (ref 12.0–15.0)
MCH: 24.9 pg — ABNORMAL LOW (ref 26.0–34.0)
MCHC: 31.6 g/dL (ref 30.0–36.0)
MCV: 78.8 fL (ref 78.0–100.0)
PLATELETS: 257 10*3/uL (ref 150–400)
RBC: 2.73 MIL/uL — ABNORMAL LOW (ref 3.87–5.11)
RDW: 15.3 % (ref 11.5–15.5)
WBC: 7.4 10*3/uL (ref 4.0–10.5)

## 2018-07-22 LAB — URINALYSIS, ROUTINE W REFLEX MICROSCOPIC
Bilirubin Urine: NEGATIVE
Glucose, UA: NEGATIVE mg/dL
HGB URINE DIPSTICK: NEGATIVE
Ketones, ur: NEGATIVE mg/dL
Leukocytes, UA: NEGATIVE
Nitrite: NEGATIVE
PROTEIN: NEGATIVE mg/dL
SPECIFIC GRAVITY, URINE: 1.009 (ref 1.005–1.030)
pH: 7 (ref 5.0–8.0)

## 2018-07-22 MED ORDER — NIFEDIPINE ER 30 MG PO TB24: 30.0000 mg | ORAL_TABLET | Freq: Every day | ORAL | 0 refills | Status: AC

## 2018-07-22 MED ORDER — HYDROMORPHONE HCL 1 MG/ML IJ SOLN
1.0000 mg | Freq: Once | INTRAMUSCULAR | Status: AC
  Administered 2018-07-22: 1 mg via INTRAMUSCULAR
  Filled 2018-07-22: qty 1

## 2018-07-22 NOTE — Progress Notes (Signed)
Patient premedicated with Dilaudid 1mg  for staple removal.  Patient tolerated procedure well.  Staples removed and patient states no pain at this time.

## 2018-07-22 NOTE — MAU Note (Signed)
CRITICAL VALUE STICKER  CRITICAL VALUE: Hgb 6.8  RECEIVER (on-site recipient of call): Marvene Strohm robinson rnc  DATE & TIME NOTIFIED: 07/22/18 @ 1207  MESSENGER (representative from lab): Milda SmartBenita  MD NOTIFIED: Venia CarbonJennifer Rasch, NP  TIME OF NOTIFICATION: 07/22/18 @ 1207  RESPONSE: no new orders received at this time.

## 2018-07-22 NOTE — Discharge Instructions (Signed)
Anemia Anemia is a condition in which you do not have enough red blood cells or hemoglobin. Hemoglobin is a substance in red blood cells that carries oxygen. When you do not have enough red blood cells or hemoglobin (are anemic), your body cannot get enough oxygen and your organs may not work properly. As a result, you may feel very tired or have other problems. What are the causes? Common causes of anemia include:  Excessive bleeding. Anemia can be caused by excessive bleeding inside or outside the body, including bleeding from the intestine or from periods in women.  Poor nutrition.  Long-lasting (chronic) kidney, thyroid, and liver disease.  Bone marrow disorders.  Cancer and treatments for cancer.  HIV (human immunodeficiency virus) and AIDS (acquired immunodeficiency syndrome).  Treatments for HIV and AIDS.  Spleen problems.  Blood disorders.  Infections, medicines, and autoimmune disorders that destroy red blood cells.  What are the signs or symptoms? Symptoms of this condition include:  Minor weakness.  Dizziness.  Headache.  Feeling heartbeats that are irregular or faster than normal (palpitations).  Shortness of breath, especially with exercise.  Paleness.  Cold sensitivity.  Indigestion.  Nausea.  Difficulty sleeping.  Difficulty concentrating.  Symptoms may occur suddenly or develop slowly. If your anemia is mild, you may not have symptoms. How is this diagnosed? This condition is diagnosed based on:  Blood tests.  Your medical history.  A physical exam.  Bone marrow biopsy.  Your health care provider may also check your stool (feces) for blood and may do additional testing to look for the cause of your bleeding. You may also have other tests, including:  Imaging tests, such as a CT scan or MRI.  Endoscopy.  Colonoscopy.  How is this treated? Treatment for this condition depends on the cause. If you continue to lose a lot of blood,  you may need to be treated at a hospital. Treatment may include:  Taking supplements of iron, vitamin T02, or folic acid.  Taking a hormone medicine (erythropoietin) that can help to stimulate red blood cell growth.  Having a blood transfusion. This may be needed if you lose a lot of blood.  Making changes to your diet.  Having surgery to remove your spleen.  Follow these instructions at home:  Take over-the-counter and prescription medicines only as told by your health care provider.  Take supplements only as told by your health care provider.  Follow any diet instructions that you were given.  Keep all follow-up visits as told by your health care provider. This is important. Contact a health care provider if:  You develop new bleeding anywhere in the body. Get help right away if:  You are very weak.  You are short of breath.  You have pain in your abdomen or chest.  You are dizzy or feel faint.  You have trouble concentrating.  You have bloody or black, tarry stools.  You vomit repeatedly or you vomit up blood. Summary  Anemia is a condition in which you do not have enough red blood cells or enough of a substance in your red blood cells that carries oxygen (hemoglobin).  Symptoms may occur suddenly or develop slowly.  If your anemia is mild, you may not have symptoms.  This condition is diagnosed with blood tests as well as a medical history and physical exam. Other tests may be needed.  Treatment for this condition depends on the cause of the anemia. This information is not intended to replace advice  given to you by your health care provider. Make sure you discuss any questions you have with your health care provider. °Document Released: 11/25/2004 Document Revised: 11/19/2016 Document Reviewed: 11/19/2016 °Elsevier Interactive Patient Education © 2018 Elsevier Inc. ° °

## 2018-07-22 NOTE — MAU Provider Note (Signed)
History     CSN: [id number]  Arrival date and time: 07/22/18 1023   First Provider Initiated Contact with Patient 07/22/18 1120      Chief Complaint  Patient presents with  . Wound Check   HPI   [name] is a 27 y.o. female G3P3003 status post cesarean section on 9/11 here in MAU for a wound check. Says she still has stables in her incision and would like them to have removed because they are hurting her. Denies HA, or changes in vision. Vaginal bleeding has decreased. She is not taking any medications for her symptoms.   OB History    Gravida  3   Para  3   Term  3   Preterm      AB      Living  3     SAB      TAB      Ectopic      Multiple  0   Live Births  3           Past Medical History:  Diagnosis Date  . Medical history non-contributory   . No pertinent past medical history     Past Surgical History:  Procedure Laterality Date  . CESAREAN SECTION  05/28/2012   Procedure: CESAREAN SECTION;  Surgeon: Lahoma Crocker, MD;  Location: [facility];  Service: Gynecology;  Laterality: N/A;  Primary cesarean section with delivery of baby girl at 44.   Marland Kitchen CESAREAN SECTION N/A 07/12/2018   Procedure: CESAREAN SECTION;  Surgeon: Allyn Kenner, DO;  Location: [facility];  Service: Obstetrics;  Laterality: N/ANira Conn, RNFA    History reviewed. No pertinent family history.  Social History   Tobacco Use  . Smoking status: Never Smoker  . Smokeless tobacco: Never Used  Substance Use Topics  . Alcohol use: No  . Drug use: No    Allergies: No Known Allergies  Medications Prior to Admission  Medication Sig Dispense Refill Last Dose  . cholecalciferol (VITAMIN D) 1000 UNITS tablet Take 1,000 Units by mouth daily.   Past Week at Unknown time  . oxyCODONE-acetaminophen (PERCOCET/ROXICET) 5-325 MG tablet Take 1 tablet by mouth every 4 (four) hours as needed (pain scale 4-7). 30 tablet 0   . Prenatal Vit-Fe Fumarate-FA (PRENATAL  MULTIVITAMIN) TABS Take 1 tablet by mouth daily.   Past Week at Unknown time   Results for orders placed or performed during the hospital encounter of 07/22/18 (from the past 48 hour(s))  CBC     Status: Abnormal   Collection Time: 07/22/18 11:47 AM  Result Value Ref Range   WBC 7.4 4.0 - 10.5 K/uL   RBC 2.73 (L) 3.87 - 5.11 MIL/uL   Hemoglobin 6.8 (LL) 12.0 - 15.0 g/dL    Comment: REPEATED TO VERIFY CRITICAL RESULT CALLED TO, READ BACK BY AND VERIFIED WITH: CHRISTINA ROBINSON 1205 07/20/2018 BY A POTEAT    HCT 21.5 (L) 36.0 - 46.0 %   MCV 78.8 78.0 - 100.0 fL   MCH 24.9 (L) 26.0 - 34.0 pg   MCHC 31.6 30.0 - 36.0 g/dL   RDW 15.3 11.5 - 15.5 %   Platelets 257 150 - 400 K/uL    Comment: Performed at [facility], [address], [place], [place] [address]  Comprehensive metabolic panel     Status: Abnormal   Collection Time: 07/22/18 11:47 AM  Result Value Ref Range   Sodium 141 135 - 145 mmol/L   Potassium 4.5 3.5 -  5.1 mmol/L   Chloride 109 98 - 111 mmol/L   CO2 25 22 - 32 mmol/L   Glucose, Bld 95 70 - 99 mg/dL   BUN 10 6 - 20 mg/dL   Creatinine, Ser 0.75 0.44 - 1.00 mg/dL   Calcium 8.4 (L) 8.9 - 10.3 mg/dL   Total Protein 6.2 (L) 6.5 - 8.1 g/dL   Albumin 2.8 (L) 3.5 - 5.0 g/dL   AST 19 15 - 41 U/L   ALT 25 0 - 44 U/L   Alkaline Phosphatase 88 38 - 126 U/L   Total Bilirubin 0.5 0.3 - 1.2 mg/dL   GFR calc non Af Amer >60 >60 mL/min   GFR calc Af Amer >60 >60 mL/min    Comment: (NOTE) The eGFR has been calculated using the CKD EPI equation. This calculation has not been validated in all clinical situations. eGFR's persistently <60 mL/min signify possible Chronic Kidney Disease.    Anion gap 7 5 - 15    Comment: Performed at [facility], [address] [place], [place] [address]  Protein / creatinine ratio, urine     Status: Abnormal   Collection Time: 07/22/18  1:03 PM  Result Value Ref Range   Creatinine, Urine 44.00 mg/dL   Total Protein, Urine 10 mg/dL     Comment: NO NORMAL RANGE ESTABLISHED FOR THIS TEST   Protein Creatinine Ratio 0.23 (H) 0.00 - 0.15 mg/mg[Cre]    Comment: Performed at [facility], [address], [place], [place] [address]  Urinalysis, Routine w reflex microscopic     Status: Abnormal   Collection Time: 07/22/18  1:03 PM  Result Value Ref Range   Color, Urine STRAW (A) YELLOW   APPearance CLEAR CLEAR   Specific Gravity, Urine 1.009 1.005 - 1.030   pH 7.0 5.0 - 8.0   Glucose, UA NEGATIVE NEGATIVE mg/dL   Hgb urine dipstick NEGATIVE NEGATIVE   Bilirubin Urine NEGATIVE NEGATIVE   Ketones, ur NEGATIVE NEGATIVE mg/dL   Protein, ur NEGATIVE NEGATIVE mg/dL   Nitrite NEGATIVE NEGATIVE   Leukocytes, UA NEGATIVE NEGATIVE    Comment: Performed at [facility], [address] [place], [place] [address]    Review of Systems  Constitutional: Negative for fever.  Eyes: Negative for photophobia.  Gastrointestinal: Negative for abdominal pain (Where stables are ).  Genitourinary: Positive for vaginal bleeding (small amount ).  Neurological: Positive for dizziness (Occasional). Negative for headaches.   Physical Exam   Blood pressure (!) 144/89, pulse 60, temperature 98.1 F (36.7 C), temperature source Oral, resp. rate 18, weight 64.9 kg, SpO2 99 %, unknown if currently breastfeeding.  Patient Vitals for the past 24 hrs:  BP Temp Temp src Pulse Resp SpO2 Weight  07/22/18 1230 (!) 144/89 - - 60 - 99 % -  07/22/18 1200 136/90 - - (!) 59 - 97 % -  07/22/18 1145 (!) 151/97 - - 63 - 97 % -  07/22/18 1130 (!) 145/85 - - 60 - 97 % -  07/22/18 1124 (!) 149/92 - - 66 - - -  07/22/18 1115 135/81 - - (!) 59 - - -  07/22/18 1100 137/76 - - 62 - - -  07/22/18 1051 (!) 149/97 - - 63 - - -  07/22/18 1034 (!) 143/82 98.1 F (36.7 C) Oral 75 18 100 % 64.9 kg    Physical Exam  Constitutional: She is oriented to person, place, and time. She appears well-developed and well-nourished. No distress.  HENT:  Head: Normocephalic.  Eyes: Pupils are equal, round, and reactive to light.  Respiratory: Effort normal.  GI: Soft.  Stables removed by RN without difficulty. Incision approximated, clean, dry, intact. No odor, no drainage.   Neurological: She is alert and oriented to person, place, and time.  Skin: Skin is warm. She is not diaphoretic.  Psychiatric: Her behavior is normal.   MAU Course  Procedures  None  MDM  PIH labs Dilaudid 1 mg IM given prior to stables removed.  UA Discussed patient with Dr. Harrington Challenger, discussed labs and BP check with Dr. Harrington Challenger. [place] for [employer] home with close f/u in the office. Hgb down, however patient is asymptomatic and bleeding has decreased since dc home.   Assessment and Plan   A:  Postpartum hypertension  Anemia due to acute blood loss  Encounter for post surgical wound check   P:  Discharge home in stable condition Preeclampsia precautions Call the office on Monday to schedule a f/u visit early next week for BP check and wound check RX: Procardia XL 30 mg daily Return to MAU if symptoms worsen Continue OTC iron BID Increase oral fluids.   Noni Saupe I, NP 07/22/2018 3:08 PM

## 2018-07-22 NOTE — MAU Note (Signed)
Sandra Wolfe is a 27 y.o.  here in MAU reporting: that she needs her stables removed from her incision. C/s was on 07/12/18 Denies any other complaints. Pain score: denies Vitals:   07/22/18 1034  BP: (!) 143/82  Pulse: 75  Resp: 18  Temp: 98.1 F (36.7 C)  SpO2: 100%      Lab orders placed from triage: none

## 2018-07-24 ENCOUNTER — Encounter (HOSPITAL_COMMUNITY)
Admission: RE | Admit: 2018-07-24 | Discharge: 2018-07-24 | Disposition: A | Discharge status: Home / Self Care | Payer: 59 | Source: Ambulatory Visit

## 2018-07-25 ENCOUNTER — Inpatient Hospital Stay (HOSPITAL_COMMUNITY): Admission: RE | Admit: 2018-07-25 | Payer: 59 | Source: Ambulatory Visit | Admitting: Obstetrics and Gynecology

## 2019-04-24 ENCOUNTER — Ambulatory Visit: Payer: Self-pay

## 2019-04-24 ENCOUNTER — Telehealth: Payer: 59 | Admitting: Nurse Practitioner

## 2019-04-24 DIAGNOSIS — Z20822 Contact with and (suspected) exposure to covid-19: Secondary | ICD-10-CM

## 2019-04-24 NOTE — Telephone Encounter (Signed)
Patient called and says she has symptoms and would like to be tested for COVID-19. I asked does she have a PCP, she says no. I advised that the drive up testing sites require a physician recommendation, she will need to establish with a provider, then have a virtual visit and the provider will determine the need for testing. I advised tonight she could do an e-visit through [employer] and that provider could also recommend testing, then a nurse will call her tomorrow to scheduled, she verbalized understanding. I advised if she decides to establish care, to call [phone] back tomorrow when the offices open from 0800-1600 to schedule a new patient appointment, she verbalized understanding.

## 2019-04-24 NOTE — Progress Notes (Signed)
E-Visit for Corona Virus Screening   Your current symptoms could be consistent with the coronavirus.  Call your health care provider or local health department to request and arrange formal testing. Many health care providers can now test patients at their office but not all are.  Please quarantine yourself while awaiting your test results.  [facility] [phone], [facility] [phone], [facility] [phone] or visit [email or web address]     COVID-19 is a respiratory illness with symptoms that are similar to the flu. Symptoms are typically mild to moderate, but there have been cases of severe illness and death due to the virus. The following symptoms may appear 2-14 days after exposure: . Fever . Cough . Shortness of breath or difficulty breathing . Chills . Repeated shaking with chills . Muscle pain . Headache . Sore throat . New loss of taste or smell . Fatigue . Congestion or runny nose . Nausea or vomiting . Diarrhea  It is vitally important that if you feel that you have an infection such as this virus or any other virus that you stay home and away from places where you may spread it to others.  You should self-quarantine for 14 days if you have symptoms that could potentially be coronavirus or have been in close contact a with a person diagnosed with COVID-19 within the last 2 weeks. You should avoid contact with people age 65 and older.   You should wear a mask or cloth face covering over your nose and mouth if you must be around other people or animals, including pets (even at home). Try to stay at least 6 feet away from other people. This will protect the people around you.  You can use medication such as delsym or mucinex if develop a cough   You may also take acetaminophen (Tylenol) as needed for fever.   Reduce your risk of any infection by using  the same precautions used for avoiding the common cold or flu:  . Wash your hands often with soap and warm water for at least 20 seconds.  If soap and water are not readily available, use an alcohol-based hand sanitizer with at least 60% alcohol.  . If coughing or sneezing, cover your mouth and nose by coughing or sneezing into the elbow areas of your shirt or coat, into a tissue or into your sleeve (not your hands). . Avoid shaking hands with others and consider head nods or verbal greetings only. . Avoid touching your eyes, nose, or mouth with unwashed hands.  . Avoid close contact with people who are sick. . Avoid places or events with large numbers of people in one location, like concerts or sporting events. . Carefully consider travel plans you have or are making. . If you are planning any travel outside or inside the [place], visit the CDC's Travelers' Health webpage for the latest health notices. . If you have some symptoms but not all symptoms, continue to monitor at home and seek medical attention if your symptoms worsen. . If you are having a medical emergency, call 911.  HOME CARE . Only take medications as instructed by your medical team. . Drink plenty of fluids and get plenty of rest. . A steam or ultrasonic humidifier can help if you have congestion.   GET HELP RIGHT AWAY IF YOU HAVE EMERGENCY WARNING SIGNS** FOR COVID-19. If you or someone is showing any of these signs seek emergency medical care immediately. Call 911 or proceed   to your closest emergency facility if: . You develop worsening high fever. . Trouble breathing . Bluish lips or face . Persistent pain or pressure in the chest . New confusion . Inability to wake or stay awake . You cough up blood. . Your symptoms become more severe  **This list is not all possible symptoms. Contact your medical provider for any symptoms that are sever or concerning to you.   MAKE SURE YOU   Understand these instructions.  Will  watch your condition.  Will get help right away if you are not doing well or get worse.  Your e-visit answers were reviewed by a board certified advanced clinical practitioner to complete your personal care plan.  Depending on the condition, your plan could have included both over the counter or prescription medications.  If there is a problem please reply once you have received a response from your provider.  Your safety is important to us.  If you have drug allergies check your prescription carefully.    You can use MyChart to ask questions about today's visit, request a non-urgent call back, or ask for a work or school excuse for 24 hours related to this e-Visit. If it has been greater than 24 hours you will need to follow up with your provider, or enter a new e-Visit to address those concerns. You will get an e-mail in the next two days asking about your experience.  I hope that your e-visit has been valuable and will speed your recovery. Thank you for using e-visits.   5-10 minutes spent reviewing and documenting in chart.  

## 2019-04-25 ENCOUNTER — Other Ambulatory Visit: Payer: Self-pay | Admitting: Internal Medicine

## 2019-04-25 DIAGNOSIS — Z20822 Contact with and (suspected) exposure to covid-19: Secondary | ICD-10-CM

## 2019-04-29 LAB — NOVEL CORONAVIRUS, NAA: SARS-CoV-2, NAA: DETECTED — AB

## 2019-04-30 ENCOUNTER — Telehealth: Payer: Self-pay | Admitting: Critical Care Medicine

## 2019-04-30 NOTE — Telephone Encounter (Signed)
I spoke to the patient and she is having very mild symptoms is rapidly better.  She works for the [employer] and states she received the virus likely from a sister her mother who been sick but have not been tested  She only has minimal symptoms of loss of taste and smell and a slight headache and low-grade fever.  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

## 2019-04-30 NOTE — Telephone Encounter (Signed)
-----   Message from Carlisle Beers, RN sent at 04/29/2019  6:40 PM EDT ----- Results called to pt and pt given instructions to call 911 for SOB or difficulty breathing or dehydration. Pt given information of signs and symptoms and instructed pt that whole house should be under quarantine. Pt given information about isolation and preventing transmission.

## 2019-05-01 ENCOUNTER — Encounter (INDEPENDENT_AMBULATORY_CARE_PROVIDER_SITE_OTHER): Payer: Self-pay

## 2019-05-02 ENCOUNTER — Encounter (INDEPENDENT_AMBULATORY_CARE_PROVIDER_SITE_OTHER): Payer: Self-pay

## 2019-05-03 ENCOUNTER — Encounter (INDEPENDENT_AMBULATORY_CARE_PROVIDER_SITE_OTHER): Payer: Self-pay

## 2019-05-05 ENCOUNTER — Encounter (INDEPENDENT_AMBULATORY_CARE_PROVIDER_SITE_OTHER): Payer: Self-pay

## 2019-05-07 ENCOUNTER — Encounter (INDEPENDENT_AMBULATORY_CARE_PROVIDER_SITE_OTHER): Payer: Self-pay
